# Patient Record
Sex: Female | Born: 1940
Health system: Southern US, Community
[De-identification: ages and names within clinical notes are randomized; demographics above are authoritative.]

## PROBLEM LIST (undated history)

## (undated) DIAGNOSIS — E079 Disorder of thyroid, unspecified: Secondary | ICD-10-CM

## (undated) DIAGNOSIS — E7800 Pure hypercholesterolemia, unspecified: Secondary | ICD-10-CM

## (undated) DIAGNOSIS — F419 Anxiety disorder, unspecified: Secondary | ICD-10-CM

## (undated) DIAGNOSIS — G459 Transient cerebral ischemic attack, unspecified: Secondary | ICD-10-CM

## (undated) HISTORY — DX: Transient cerebral ischemic attack, unspecified: G45.9

## (undated) HISTORY — DX: Anxiety disorder, unspecified: F41.9

## (undated) HISTORY — PX: CHOLECYSTECTOMY: SHX55

---

## 2014-09-22 ENCOUNTER — Other Ambulatory Visit: Payer: Self-pay | Admitting: Internal Medicine

## 2014-09-22 DIAGNOSIS — H9201 Otalgia, right ear: Secondary | ICD-10-CM

## 2014-09-22 DIAGNOSIS — G500 Trigeminal neuralgia: Secondary | ICD-10-CM

## 2014-09-22 DIAGNOSIS — R519 Headache, unspecified: Secondary | ICD-10-CM

## 2014-09-22 DIAGNOSIS — R51 Headache: Secondary | ICD-10-CM

## 2014-09-29 ENCOUNTER — Ambulatory Visit
Admission: RE | Admit: 2014-09-29 | Discharge: 2014-09-29 | Disposition: A | Discharge status: Home / Self Care | Payer: PRIVATE HEALTH INSURANCE | Source: Ambulatory Visit | Attending: Internal Medicine | Admitting: Internal Medicine

## 2014-09-29 ENCOUNTER — Other Ambulatory Visit: Payer: Self-pay | Admitting: Internal Medicine

## 2014-09-29 DIAGNOSIS — G500 Trigeminal neuralgia: Secondary | ICD-10-CM

## 2014-09-29 DIAGNOSIS — R519 Headache, unspecified: Secondary | ICD-10-CM

## 2014-09-29 DIAGNOSIS — R51 Headache: Secondary | ICD-10-CM

## 2014-09-29 DIAGNOSIS — H9201 Otalgia, right ear: Secondary | ICD-10-CM

## 2016-09-16 ENCOUNTER — Encounter (HOSPITAL_COMMUNITY): Payer: Self-pay | Admitting: Emergency Medicine

## 2016-09-16 ENCOUNTER — Ambulatory Visit (HOSPITAL_COMMUNITY)
Admission: EM | Admit: 2016-09-16 | Discharge: 2016-09-16 | Disposition: A | Discharge status: Home / Self Care | Payer: Medicare Other | Attending: Emergency Medicine | Admitting: Emergency Medicine

## 2016-09-16 DIAGNOSIS — H6501 Acute serous otitis media, right ear: Secondary | ICD-10-CM | POA: Diagnosis not present

## 2016-09-16 HISTORY — DX: Disorder of thyroid, unspecified: E07.9

## 2016-09-16 HISTORY — DX: Pure hypercholesterolemia, unspecified: E78.00

## 2016-09-16 MED ORDER — FLUCONAZOLE 150 MG PO TABS: 150.0000 mg | ORAL_TABLET | Freq: Once | ORAL | 0 refills | Status: AC

## 2016-09-16 MED ORDER — AMOXICILLIN 875 MG PO TABS: 875.0000 mg | ORAL_TABLET | Freq: Two times a day (BID) | ORAL | 0 refills | Status: DC

## 2016-09-16 NOTE — Discharge Instructions (Signed)
You have fluid in your ear. This is likely causing your symptoms. Take amoxicillin twice a day for 10 days. Continue ibuprofen and heat as needed. Use the Diflucan if you develop symptoms of a yeast infection. If this becomes a recurring problem, please follow-up with your primary care doctor as you may need a referral to ENT.

## 2016-09-16 NOTE — ED Provider Notes (Signed)
[facility]    CSN: [id number] Arrival date & time: 09/16/16  1354     History   Chief Complaint Chief Complaint  Patient presents with  . Otalgia    HPI Olivia Estes is a 76 y.o. female.   HPI  She is a 76 year old woman here with her daughter for evaluation of right earache. Symptoms started yesterday with an intermittent right earache. She also reports some right-sided maxillary sinus pain and pressure. This is associated with right-sided drainage that was blood-tinged. No fevers. No cough. She does report some sneezing. She took ibuprofen and applied heat yesterday. Today, her symptoms are better but she continues to have some right-sided maxillary pressure.  Past Medical History:  Diagnosis Date  . Hypercholesterolemia   . Thyroid disease     There are no active problems to display for this patient.   Past Surgical History:  Procedure Laterality Date  . CHOLECYSTECTOMY      OB History    No data available       Home Medications    Prior to Admission medications   Medication Sig Start Date End Date Taking? Authorizing Provider  alendronate (FOSAMAX) 70 MG tablet Take 70 mg by mouth once a week. Take with a full glass of water on an empty stomach.   Yes Historical Provider, MD  cyclobenzaprine (FLEXERIL) 10 MG tablet Take 10 mg by mouth 3 (three) times daily as needed for muscle spasms.   Yes Historical Provider, MD  levothyroxine (SYNTHROID, LEVOTHROID) 75 MCG tablet Take 75 mcg by mouth daily before breakfast.   Yes Historical Provider, MD  rosuvastatin (CRESTOR) 5 MG tablet Take 5 mg by mouth daily.   Yes Historical Provider, MD  amoxicillin (AMOXIL) 875 MG tablet Take 1 tablet (875 mg total) by mouth 2 (two) times daily. 09/16/16   Charm RingsErin J Honig, MD  fluconazole (DIFLUCAN) 150 MG tablet Take 1 tablet (150 mg total) by mouth once. Take 2nd pill if symptoms not completely resolved in 3 days. 09/16/16 09/16/16  Charm RingsErin J Honig, MD    Family  History History reviewed. No pertinent family history.  Social History Social History  Substance Use Topics  . Smoking status: Never Smoker  . Smokeless tobacco: Never Used  . Alcohol use No     Allergies   Ceftin [cefuroxime axetil]   Review of Systems Review of Systems As in history of present illness  Physical Exam Triage Vital Signs ED Triage Vitals  Enc Vitals Group     BP 09/16/16 1439 168/96     Pulse Rate 09/16/16 1439 84     Resp 09/16/16 1439 18     Temp 09/16/16 1439 98.2 F (36.8 C)     Temp Source 09/16/16 1439 Oral     SpO2 09/16/16 1439 98 %     Weight --      Height --      Head Circumference --      Peak Flow --      Pain Score 09/16/16 1444 1     Pain Loc --      Pain Edu? --      Excl. in GC? --    No data found.   Updated Vital Signs BP 168/96 (BP Location: Left Arm)   Pulse 84   Temp 98.2 F (36.8 C) (Oral)   Resp 18   SpO2 98%   Visual Acuity Right Eye Distance:   Left Eye Distance:   Bilateral Distance:  Right Eye Near:   Left Eye Near:    Bilateral Near:     Physical Exam  Constitutional: She is oriented to person, place, and time. She appears well-developed and well-nourished. No distress.  HENT:  Left TM is normal. Right TM has a clear effusion. Left knee are normal. Right naris erythematous and edematous. It does appear friable.  Neck: Neck supple.  Cardiovascular: Normal rate.   Pulmonary/Chest: Effort normal.  Lymphadenopathy:    She has no cervical adenopathy.  Neurological: She is alert and oriented to person, place, and time.     UC Treatments / Results  Labs (all labs ordered are listed, but only abnormal results are displayed) Labs Reviewed - No data to display  EKG  EKG Interpretation None       Radiology No results found.  Procedures Procedures (including critical care time)  Medications Ordered in UC Medications - No data to display   Initial Impression / Assessment and Plan / UC  Course  I have reviewed the triage vital signs and the nursing notes.  Pertinent labs & imaging results that were available during my care of the patient were reviewed by me and considered in my medical decision making (see chart for details).  Clinical Course     We'll treat with amoxicillin. Continue ibuprofen and heat as needed. Prescription for Diflucan provided she often gets yeast infections after antibiotics. Follow-up as needed.  Final Clinical Impressions(s) / UC Diagnoses   Final diagnoses:  Right acute serous otitis media, recurrence not specified    New Prescriptions Discharge Medication List as of 09/16/2016  3:16 PM    START taking these medications   Details  amoxicillin (AMOXIL) 875 MG tablet Take 1 tablet (875 mg total) by mouth 2 (two) times daily., Starting Mon 09/16/2016, Normal    fluconazole (DIFLUCAN) 150 MG tablet Take 1 tablet (150 mg total) by mouth once. Take 2nd pill if symptoms not completely resolved in 3 days., Starting Mon 09/16/2016, Normal         Charm Rings, MD 09/16/16 1535

## 2016-09-16 NOTE — ED Triage Notes (Signed)
[facility] a [facility] of right side ear pain and sinus pain and pressure that started yesterday.

## 2016-11-08 ENCOUNTER — Emergency Department (HOSPITAL_COMMUNITY): Payer: PRIVATE HEALTH INSURANCE

## 2016-11-08 ENCOUNTER — Ambulatory Visit (HOSPITAL_COMMUNITY)
Admission: EM | Admit: 2016-11-08 | Discharge: 2016-11-08 | Disposition: A | Discharge status: Home / Self Care | Payer: PRIVATE HEALTH INSURANCE

## 2016-11-08 ENCOUNTER — Encounter (HOSPITAL_COMMUNITY): Payer: Self-pay

## 2016-11-08 ENCOUNTER — Emergency Department (HOSPITAL_COMMUNITY)
Admission: EM | Admit: 2016-11-08 | Discharge: 2016-11-08 | Disposition: A | Discharge status: Home / Self Care | Payer: PRIVATE HEALTH INSURANCE | Attending: Emergency Medicine | Admitting: Emergency Medicine

## 2016-11-08 DIAGNOSIS — R1031 Right lower quadrant pain: Secondary | ICD-10-CM

## 2016-11-08 DIAGNOSIS — Z79899 Other long term (current) drug therapy: Secondary | ICD-10-CM | POA: Insufficient documentation

## 2016-11-08 LAB — URINALYSIS, ROUTINE W REFLEX MICROSCOPIC
Bilirubin Urine: NEGATIVE
GLUCOSE, UA: NEGATIVE mg/dL
Hgb urine dipstick: NEGATIVE
Ketones, ur: 5 mg/dL — AB
LEUKOCYTES UA: NEGATIVE
Nitrite: NEGATIVE
PH: 6 (ref 5.0–8.0)
PROTEIN: NEGATIVE mg/dL
Specific Gravity, Urine: 1.004 — ABNORMAL LOW (ref 1.005–1.030)

## 2016-11-08 LAB — CBC
HEMATOCRIT: 40 % (ref 36.0–46.0)
HEMOGLOBIN: 13.8 g/dL (ref 12.0–15.0)
MCH: 28.9 pg (ref 26.0–34.0)
MCHC: 34.5 g/dL (ref 30.0–36.0)
MCV: 83.9 fL (ref 78.0–100.0)
Platelets: 222 10*3/uL (ref 150–400)
RBC: 4.77 MIL/uL (ref 3.87–5.11)
RDW: 12.8 % (ref 11.5–15.5)
WBC: 7.4 10*3/uL (ref 4.0–10.5)

## 2016-11-08 LAB — COMPREHENSIVE METABOLIC PANEL
ALT: 16 U/L (ref 14–54)
AST: 21 U/L (ref 15–41)
Albumin: 4.7 g/dL (ref 3.5–5.0)
Alkaline Phosphatase: 40 U/L (ref 38–126)
Anion gap: 7 (ref 5–15)
BUN: 16 mg/dL (ref 6–20)
CO2: 27 mmol/L (ref 22–32)
Calcium: 9.5 mg/dL (ref 8.9–10.3)
Chloride: 107 mmol/L (ref 101–111)
Creatinine, Ser: 0.61 mg/dL (ref 0.44–1.00)
GFR calc Af Amer: >60 mL/min (ref >60)
GFR calc non Af Amer: >60 mL/min (ref >60)
Glucose, Bld: 109 mg/dL — ABNORMAL HIGH (ref 65–99)
Potassium: 3.9 mmol/L (ref 3.5–5.1)
Sodium: 141 mmol/L (ref 135–145)
Total Bilirubin: 1.6 mg/dL — ABNORMAL HIGH (ref 0.3–1.2)
Total Protein: 7.5 g/dL (ref 6.5–8.1)

## 2016-11-08 LAB — LIPASE, BLOOD: Lipase: 22 U/L (ref 11–51)

## 2016-11-08 MED ORDER — IOPAMIDOL (ISOVUE-300) INJECTION 61%
100.0000 mL | Freq: Once | INTRAVENOUS | Status: AC | PRN
  Administered 2016-11-08: 75 mL via INTRAVENOUS

## 2016-11-08 MED ORDER — IOPAMIDOL (ISOVUE-300) INJECTION 61%
INTRAVENOUS | Status: AC
  Filled 2016-11-08: qty 100

## 2016-11-08 NOTE — ED Triage Notes (Signed)
Pt states that her PCP recommended that she be evaluated for her RLQ abdominal pain. Pt states that the pain comes and goes. She denies N/V, but states that she had an episode of diarrhea yesterday. A&Ox4. Ambulatory.

## 2016-11-08 NOTE — ED Notes (Signed)
ED Provider at bedside. 

## 2016-11-08 NOTE — ED Notes (Signed)
Patient transported to CT 

## 2016-11-08 NOTE — ED Provider Notes (Signed)
[facility] Provider Note   CSN: [id number] Arrival date & time: 11/08/16  1835     History   Chief Complaint Chief Complaint  Patient presents with  . Abdominal Pain    HPI Olivia Estes is a 76 y.o. female.  76 yo F with a chief complaint of right lower quadrant abdominal pain. This been going on for the past couple days. Pain lasts for just seconds and then goes away. She describes it as sharp and shooting. Denies radiation. Denies anything that makes it better or worse. Denies fevers denies chills denies nausea vomiting or diarrhea. Denies constipation. Denies urinary symptoms.   The history is provided by the patient.  Abdominal Pain   This is a new problem. The current episode started 2 days ago. The problem occurs constantly. The problem has not changed since onset.The pain is associated with an unknown factor. The pain is located in the RLQ. The quality of the pain is cramping, sharp and shooting. The pain is at a severity of 5/10. The pain is moderate. Pertinent negatives include fever, nausea, vomiting, constipation, dysuria, headaches, arthralgias and myalgias. Nothing aggravates the symptoms. Nothing relieves the symptoms.    Past Medical History:  Diagnosis Date  . Hypercholesterolemia   . Thyroid disease     There are no active problems to display for this patient.   Past Surgical History:  Procedure Laterality Date  . CHOLECYSTECTOMY      OB History    No data available       Home Medications    Prior to Admission medications   Medication Sig Start Date End Date Taking? Authorizing Provider  alendronate (FOSAMAX) 70 MG tablet Take 70 mg by mouth once a week. Take with a full glass of water on an empty stomach.   Yes Historical Provider, MD  CRANBERRY PO Take 2 tablets by mouth daily.   Yes Historical Provider, MD  cyclobenzaprine (FLEXERIL) 10 MG tablet Take 10 mg by mouth 3 (three) times daily as needed for muscle spasms.   Yes Historical  Provider, MD  ESTRACE VAGINAL 0.1 MG/GM vaginal cream Place 1 g vaginally 2 (two) times a week. Thursday & Sunday 08/10/16  Yes Historical Provider, MD  ibuprofen (ADVIL,MOTRIN) 200 MG tablet Take 600 mg by mouth every 6 (six) hours as needed.   Yes Historical Provider, MD  levothyroxine (SYNTHROID, LEVOTHROID) 75 MCG tablet Take 75 mcg by mouth daily before breakfast.   Yes Historical Provider, MD  Multiple Vitamins-Minerals (CENTRUM SILVER ULTRA WOMENS PO) Take 1 tablet by mouth daily.   Yes Historical Provider, MD  rosuvastatin (CRESTOR) 5 MG tablet Take 5 mg by mouth daily.   Yes Historical Provider, MD  saccharomyces boulardii (FLORASTOR) 250 MG capsule Take 250 mg by mouth 2 (two) times daily.   Yes Historical Provider, MD    Family History History reviewed. No pertinent family history.  Social History Social History  Substance Use Topics  . Smoking status: Never Smoker  . Smokeless tobacco: Never Used  . Alcohol use No     Allergies   Ceftin [cefuroxime axetil]   Review of Systems Review of Systems  Constitutional: Negative for chills and fever.  HENT: Negative for congestion and rhinorrhea.   Eyes: Negative for redness and visual disturbance.  Respiratory: Negative for shortness of breath and wheezing.   Cardiovascular: Negative for chest pain and palpitations.  Gastrointestinal: Positive for abdominal pain. Negative for constipation, nausea and vomiting.  Genitourinary: Negative for dysuria and urgency.  Musculoskeletal: Negative for arthralgias and myalgias.  Skin: Negative for pallor and wound.  Neurological: Negative for dizziness and headaches.     Physical Exam Updated Vital Signs BP 147/88 (BP Location: Right Arm)   Pulse 72   Temp 98.2 F (36.8 C) (Oral)   Resp 16   Ht 4\' 11"  (1.499 m)   Wt 120 lb (54.4 kg)   SpO2 98%   BMI 24.24 kg/m   Physical Exam  Constitutional: She is oriented to person, place, and time. She appears well-developed and  well-nourished. No distress.  HENT:  Head: Normocephalic and atraumatic.  Eyes: EOM are normal. Pupils are equal, round, and reactive to light.  Neck: Normal range of motion. Neck supple.  Cardiovascular: Normal rate and regular rhythm.  Exam reveals no gallop and no friction rub.   No murmur heard. Pulmonary/Chest: Effort normal. She has no wheezes. She has no rales.  Abdominal: Soft. She exhibits no distension and no mass. There is tenderness (tender palpation just above the inguinal ligament on the right side.). There is no guarding.  Musculoskeletal: She exhibits no edema or tenderness.  Neurological: She is alert and oriented to person, place, and time.  Skin: Skin is warm and dry. She is not diaphoretic.  Psychiatric: She has a normal mood and affect. Her behavior is normal.  Nursing note and vitals reviewed.    ED Treatments / Results  Labs (all labs ordered are listed, but only abnormal results are displayed) Labs Reviewed  COMPREHENSIVE METABOLIC PANEL - Abnormal; Notable for the following:       Result Value   Glucose, Bld 109 (*)    Total Bilirubin 1.6 (*)    All other components within normal limits  URINALYSIS, ROUTINE W REFLEX MICROSCOPIC - Abnormal; Notable for the following:    Color, Urine STRAW (*)    Specific Gravity, Urine 1.004 (*)    Ketones, ur 5 (*)    All other components within normal limits  LIPASE, BLOOD  CBC    EKG  EKG Interpretation None       Radiology Ct Abdomen Pelvis W Contrast  Result Date: 11/08/2016 CLINICAL DATA:  Right lower quadrant pain intermittently EXAM: CT ABDOMEN AND PELVIS WITH CONTRAST TECHNIQUE: Multidetector CT imaging of the abdomen and pelvis was performed using the standard protocol following bolus administration of intravenous contrast. CONTRAST:  75mL ISOVUE-300 IOPAMIDOL (ISOVUE-300) INJECTION 61% COMPARISON:  None. FINDINGS: Lower chest: Lung bases are within normal.  Small hiatal hernia. Hepatobiliary: The  previous cholecystectomy. Liver is within normal. Slight prominence of central intrahepatic biliary ducts likely due to the post cholecystectomy state. Pancreas: Within normal. Spleen: Within normal. Adrenals/Urinary Tract: Adrenal glands are normal. Kidneys are normal in size without hydronephrosis or nephrolithiasis. Couple tiny subcentimeter renal cortical hypodensities likely cysts but too small to characterize. Ureters and bladder are normal. Stomach/Bowel: Small hiatal hernia. Small bowel is otherwise within normal. Appendix is normal. Diverticulosis of the colon without active inflammation. Vascular/Lymphatic: Mild-to-moderate calcified plaque of the abdominal aorta and iliac arteries. No evidence of adenopathy. Reproductive: Within normal. Other: No free fluid or focal inflammatory change. Musculoskeletal: Mild degenerate change of the spine and hips. IMPRESSION: No acute findings in the abdomen/ pelvis. Diverticulosis of the colon without active inflammation. Couple tiny bilateral renal cortical hypodensities too small to characterize but likely cysts. Small hiatal hernia. Aortic atherosclerosis. Electronically Signed   By: Elberta Fortis M.D.   On: 11/08/2016 21:26    Procedures Procedures (including critical care time)  Medications Ordered in ED Medications  iopamidol (ISOVUE-300) 61 % injection (not administered)  iopamidol (ISOVUE-300) 61 % injection 100 mL (75 mLs Intravenous Contrast Given 11/08/16 2052)     Initial Impression / Assessment and Plan / ED Course  I have reviewed the triage vital signs and the nursing notes.  Pertinent labs & imaging results that were available during my care of the patient were reviewed by me and considered in my medical decision making (see chart for details).     42 yoF With a chief complaint of right lower quadrant abdominal pain. Clinically this pain is very low almost adnexal.  No pain along the femoral canal. Will obtain a CT scan to further  evaluate.  CT scan was reviewed with no specific finding. I reviewed the images and in the area of the patient's pain she does have a fairly large stool burden. I discussed these results with the patient. She will try a MiraLAX cleanout at home. Will return for sudden worsening or fever.   11:48 PM:  I have discussed the diagnosis/risks/treatment options with the patient and family and believe the pt to be eligible for discharge home to follow-up with PCP. We also discussed returning to the ED immediately if new or worsening sx occur. We discussed the sx which are most concerning (e.g., sudden worsening pain, fever, inability to tolerate by mouth) that necessitate immediate return. Medications administered to the patient during their visit and any new prescriptions provided to the patient are listed below.  Medications given during this visit Medications  iopamidol (ISOVUE-300) 61 % injection (not administered)  iopamidol (ISOVUE-300) 61 % injection 100 mL (75 mLs Intravenous Contrast Given 11/08/16 2052)     The patient appears reasonably screen and/or stabilized for discharge and I doubt any other medical condition or other [facility] requiring further screening, evaluation, or treatment in the ED at this time prior to discharge.    Final Clinical Impressions(s) / ED Diagnoses   Final diagnoses:  Right lower quadrant abdominal pain    New Prescriptions Discharge Medication List as of 11/08/2016 10:19 PM       Melene Plan, DO 11/08/16 2348

## 2016-11-08 NOTE — Discharge Instructions (Signed)
Follow up with your family doc.  Return for sudden worsening pain, fever.

## 2017-10-17 ENCOUNTER — Emergency Department (HOSPITAL_COMMUNITY): Payer: Medicare Other

## 2017-10-17 ENCOUNTER — Other Ambulatory Visit: Payer: Self-pay

## 2017-10-17 ENCOUNTER — Encounter (HOSPITAL_COMMUNITY): Payer: Self-pay

## 2017-10-17 ENCOUNTER — Observation Stay (HOSPITAL_COMMUNITY)
Admission: EM | Admit: 2017-10-17 | Discharge: 2017-10-18 | Disposition: A | Discharge status: Home / Self Care | Payer: Medicare Other | Attending: Family Medicine | Admitting: Family Medicine

## 2017-10-17 DIAGNOSIS — G459 Transient cerebral ischemic attack, unspecified: Secondary | ICD-10-CM | POA: Diagnosis not present

## 2017-10-17 DIAGNOSIS — E039 Hypothyroidism, unspecified: Secondary | ICD-10-CM | POA: Diagnosis present

## 2017-10-17 DIAGNOSIS — R4701 Aphasia: Secondary | ICD-10-CM

## 2017-10-17 DIAGNOSIS — Z79899 Other long term (current) drug therapy: Secondary | ICD-10-CM | POA: Insufficient documentation

## 2017-10-17 DIAGNOSIS — E785 Hyperlipidemia, unspecified: Secondary | ICD-10-CM | POA: Diagnosis not present

## 2017-10-17 LAB — APTT: aPTT: 29 seconds (ref 24–36)

## 2017-10-17 LAB — DIFFERENTIAL
BASOS PCT: 0 %
Basophils Absolute: 0 10*3/uL (ref 0.0–0.1)
EOS PCT: 1 %
Eosinophils Absolute: 0.1 10*3/uL (ref 0.0–0.7)
Lymphocytes Relative: 30 %
Lymphs Abs: 2.3 10*3/uL (ref 0.7–4.0)
MONO ABS: 0.4 10*3/uL (ref 0.1–1.0)
MONOS PCT: 5 %
Neutro Abs: 5.1 10*3/uL (ref 1.7–7.7)
Neutrophils Relative %: 64 %

## 2017-10-17 LAB — CBC
HCT: 44.3 % (ref 36.0–46.0)
Hemoglobin: 15.3 g/dL — ABNORMAL HIGH (ref 12.0–15.0)
MCH: 29.1 pg (ref 26.0–34.0)
MCHC: 34.5 g/dL (ref 30.0–36.0)
MCV: 84.4 fL (ref 78.0–100.0)
PLATELETS: 238 10*3/uL (ref 150–400)
RBC: 5.25 MIL/uL — ABNORMAL HIGH (ref 3.87–5.11)
RDW: 12.9 % (ref 11.5–15.5)
WBC: 7.9 10*3/uL (ref 4.0–10.5)

## 2017-10-17 LAB — COMPREHENSIVE METABOLIC PANEL
ALT: 18 U/L (ref 14–54)
ANION GAP: 10 (ref 5–15)
AST: 25 U/L (ref 15–41)
Albumin: 5 g/dL (ref 3.5–5.0)
Alkaline Phosphatase: 51 U/L (ref 38–126)
BUN: 14 mg/dL (ref 6–20)
CO2: 26 mmol/L (ref 22–32)
CREATININE: 0.62 mg/dL (ref 0.44–1.00)
Calcium: 10 mg/dL (ref 8.9–10.3)
Chloride: 105 mmol/L (ref 101–111)
Glucose, Bld: 120 mg/dL — ABNORMAL HIGH (ref 65–99)
Potassium: 3.7 mmol/L (ref 3.5–5.1)
SODIUM: 141 mmol/L (ref 135–145)
Total Bilirubin: 1.3 mg/dL — ABNORMAL HIGH (ref 0.3–1.2)
Total Protein: 8.2 g/dL — ABNORMAL HIGH (ref 6.5–8.1)

## 2017-10-17 LAB — URINALYSIS, ROUTINE W REFLEX MICROSCOPIC
BILIRUBIN URINE: NEGATIVE
Glucose, UA: NEGATIVE mg/dL
Hgb urine dipstick: NEGATIVE
Ketones, ur: NEGATIVE mg/dL
Leukocytes, UA: NEGATIVE
Nitrite: NEGATIVE
PH: 6 (ref 5.0–8.0)
Protein, ur: NEGATIVE mg/dL
SPECIFIC GRAVITY, URINE: 1.005 (ref 1.005–1.030)

## 2017-10-17 LAB — I-STAT CHEM 8, ED
BUN: 13 mg/dL (ref 6–20)
CALCIUM ION: 1.18 mmol/L (ref 1.15–1.40)
CHLORIDE: 103 mmol/L (ref 101–111)
Creatinine, Ser: 0.7 mg/dL (ref 0.44–1.00)
Glucose, Bld: 118 mg/dL — ABNORMAL HIGH (ref 65–99)
HCT: 43 % (ref 36.0–46.0)
HEMOGLOBIN: 14.6 g/dL (ref 12.0–15.0)
Potassium: 4 mmol/L (ref 3.5–5.1)
SODIUM: 141 mmol/L (ref 135–145)
TCO2: 27 mmol/L (ref 22–32)

## 2017-10-17 LAB — RAPID URINE DRUG SCREEN, HOSP PERFORMED
AMPHETAMINES: NOT DETECTED
Barbiturates: NOT DETECTED
Benzodiazepines: NOT DETECTED
Cocaine: NOT DETECTED
OPIATES: NOT DETECTED
Tetrahydrocannabinol: NOT DETECTED

## 2017-10-17 LAB — I-STAT TROPONIN, ED: TROPONIN I, POC: 0 ng/mL (ref 0.00–0.08)

## 2017-10-17 LAB — PROTIME-INR
INR: 0.99
Prothrombin Time: 13 seconds (ref 11.4–15.2)

## 2017-10-17 LAB — ETHANOL

## 2017-10-17 MED ORDER — ALPRAZOLAM 0.25 MG PO TABS
0.2500 mg | ORAL_TABLET | Freq: Every evening | ORAL | Status: DC | PRN
  Administered 2017-10-18: 0.25 mg via ORAL
  Filled 2017-10-17: qty 1

## 2017-10-17 MED ORDER — LEVOTHYROXINE SODIUM 75 MCG PO TABS
75.0000 ug | ORAL_TABLET | Freq: Every day | ORAL | Status: DC
  Administered 2017-10-18: 75 ug via ORAL
  Filled 2017-10-17: qty 1

## 2017-10-17 MED ORDER — STROKE: EARLY STAGES OF RECOVERY BOOK
Freq: Once | Status: AC
  Administered 2017-10-18: 07:00:00
  Filled 2017-10-17 (×2): qty 1

## 2017-10-17 MED ORDER — ENOXAPARIN SODIUM 40 MG/0.4ML ~~LOC~~ SOLN
40.0000 mg | Freq: Every day | SUBCUTANEOUS | Status: DC
  Administered 2017-10-18: 40 mg via SUBCUTANEOUS
  Filled 2017-10-17: qty 0.4

## 2017-10-17 MED ORDER — SODIUM CHLORIDE 0.9 % IV SOLN
INTRAVENOUS | Status: DC
  Administered 2017-10-17: 23:00:00 via INTRAVENOUS

## 2017-10-17 MED ORDER — ASPIRIN 325 MG PO TABS
325.0000 mg | ORAL_TABLET | Freq: Every day | ORAL | Status: DC
  Administered 2017-10-17 – 2017-10-18 (×2): 325 mg via ORAL
  Filled 2017-10-17 (×2): qty 1

## 2017-10-17 MED ORDER — CYCLOBENZAPRINE HCL 10 MG PO TABS: 10.0000 mg | ORAL_TABLET | Freq: Three times a day (TID) | ORAL | Status: DC | PRN

## 2017-10-17 MED ORDER — ASPIRIN 300 MG RE SUPP: 300.0000 mg | Freq: Every day | RECTAL | Status: DC

## 2017-10-17 MED ORDER — ROSUVASTATIN CALCIUM 5 MG PO TABS
5.0000 mg | ORAL_TABLET | Freq: Every day | ORAL | Status: DC
  Administered 2017-10-18: 5 mg via ORAL
  Filled 2017-10-17: qty 1

## 2017-10-17 MED ORDER — ACETAMINOPHEN 160 MG/5ML PO SOLN: 650.0000 mg | ORAL | Status: DC | PRN

## 2017-10-17 MED ORDER — ACETAMINOPHEN 650 MG RE SUPP: 650.0000 mg | RECTAL | Status: DC | PRN

## 2017-10-17 MED ORDER — ACETAMINOPHEN 325 MG PO TABS: 650.0000 mg | ORAL_TABLET | ORAL | Status: DC | PRN

## 2017-10-17 NOTE — Consult Note (Signed)
Referring Physician: Dr. Hal Hope    Chief Complaint: TIA  HPI: Olivia Estes is an 77 y.o. female who presented to the [facility] ED on Friday afternoon after a 10 minute episode of slurred speech where she could not get her words out right. Her symptoms began at 1130 while speaking on the telephone. She noted that the words were "right in her head" but that they would not come out. She had no other Neurological symptoms, other than possibly some slight numbness to the left side of her head. She has not had a similar episode in the past. She called her doctor and was told to come to the ED. In Triage the patient was asymptomatic; there was no weakness of her extremities and no slurred speech. She has passed her stroke swallow screen (completed at 1800).   CT head revealed atrophy and mild chronic microvascular ischemic changes. No acute findings were seen.   EKG with NSR.  Her PMHx includes hypercholesterolemia and thyroid disease.   Past Medical History:  Diagnosis Date  . Hypercholesterolemia   . Thyroid disease     Past Surgical History:  Procedure Laterality Date  . CHOLECYSTECTOMY      Family History  Problem Relation Age of Onset  . Diabetes Mellitus II Mother   . Hypertension Father    Social History:  reports that she has quit smoking. she has never used smokeless tobacco. She reports that she does not drink alcohol or use drugs.  Allergies:  Allergies  Allergen Reactions  . Ceftin [Cefuroxime Axetil] Rash    Medications:  Prior to Admission:  Medications Prior to Admission  Medication Sig Dispense Refill Last Dose  . ALPRAZolam (XANAX) 0.25 MG tablet Take 0.25 mg by mouth at bedtime as needed.   10/16/2017 at Unknown time  . CRANBERRY PO Take 2 tablets by mouth daily.   10/17/2017 at Unknown time  . cyclobenzaprine (FLEXERIL) 10 MG tablet Take 10 mg by mouth 3 (three) times daily as needed for muscle spasms.   Past Month at Unknown time  . ESTRACE VAGINAL 0.1 MG/GM  vaginal cream Place 1 g vaginally 2 (two) times a week. Thursday & Sunday  11 10/16/2017 at Unknown time  . hydrOXYzine (ATARAX/VISTARIL) 25 MG tablet Take 25 mg by mouth at bedtime.  0 10/16/2017 at Unknown time  . ibuprofen (ADVIL,MOTRIN) 200 MG tablet Take 600 mg by mouth every 6 (six) hours as needed.   10/16/2017 at Unknown time  . levothyroxine (SYNTHROID, LEVOTHROID) 75 MCG tablet Take 75 mcg by mouth daily before breakfast.   10/17/2017 at Unknown time  . Multiple Vitamins-Minerals (CENTRUM SILVER ULTRA WOMENS PO) Take 1 tablet by mouth daily.   10/17/2017 at Unknown time  . rosuvastatin (CRESTOR) 5 MG tablet Take 5 mg by mouth daily.   10/17/2017 at Unknown time  . saccharomyces boulardii (FLORASTOR) 250 MG capsule Take 250 mg by mouth 2 (two) times daily.   10/17/2017 at Unknown time  . alendronate (FOSAMAX) 70 MG tablet Take 70 mg by mouth once a week. Take with a full glass of water on an empty stomach.   Not Taking at Unknown time    ROS: Had a dull headache like sensation in her left temporal region at the time of her transient aphasia. No other sensory symptoms. No motor weakness. Other ROS as per HPI.   Physical Examination: Blood pressure (!) 184/74, pulse 71, temperature 98.4 F (36.9 C), temperature source Oral, resp. rate 18, height 4' 11"  (1.499  m), weight 58 kg (127 lb 13.9 oz), SpO2 99 %.  HEENT: [place]/AT Lungs: Respirations unlabored Ext: No edema  Neurologic Examination: Mental Status:  Alert, oriented, thought content appropriate.  Speech fluent with intact naming, repetition and comprehension. Able to follow a 3 step directional command without difficulty. Cranial Nerves: II:  Visual fields intact. No extinction to DSS. PERRL.  III,IV, VI: EOMI without nystagmus. No ptosis. V,VII: Smile and grimace are symmetric. Facial temp sensation normal in V1 and V3 bilaterally. VIII: Hearing intact to voice IX,X: No hypophonia. Palate rises symmetrically XI: Symmetric XII: midline  tongue extension  Motor: Right : Upper extremity   5/5    Left:     Upper extremity   5/5  Lower extremity   5/5     Lower extremity   5/5 Normal tone throughout; normal bulk for age. No asymmetry. No pronator drift.  Sensory: Temp and light touch intact x 4 without extinction.  Deep Tendon Reflexes:  2+ bilateral brachioradialis, biceps and patellae. 1+ achilles bilaterally. Toes equivocal.  Cerebellar: No ataxia with FNF bilaterally Gait: Deferred.   Results for orders placed or performed during the hospital encounter of 10/17/17 (from the past 48 hour(s))  Urine rapid drug screen (hosp performed)     Status: None   Collection Time: 10/17/17  5:22 PM  Result Value Ref Range   Opiates NONE DETECTED NONE DETECTED   Cocaine NONE DETECTED NONE DETECTED   Benzodiazepines NONE DETECTED NONE DETECTED   Amphetamines NONE DETECTED NONE DETECTED   Tetrahydrocannabinol NONE DETECTED NONE DETECTED   Barbiturates NONE DETECTED NONE DETECTED    Comment: (NOTE) DRUG SCREEN FOR MEDICAL PURPOSES ONLY.  IF CONFIRMATION IS NEEDED FOR ANY PURPOSE, NOTIFY LAB WITHIN 5 DAYS. LOWEST DETECTABLE LIMITS FOR URINE DRUG SCREEN Drug Class                     Cutoff (ng/mL) Amphetamine and metabolites    1000 Barbiturate and metabolites    200 Benzodiazepine                 160 Tricyclics and metabolites     300 Opiates and metabolites        300 Cocaine and metabolites        300 THC                            50 Performed at [facility], [place] [address] [place], [place] [address]   Urinalysis, Routine w reflex microscopic     Status: Abnormal   Collection Time: 10/17/17  5:22 PM  Result Value Ref Range   Color, Urine STRAW (A) YELLOW   APPearance CLEAR CLEAR   Specific Gravity, Urine 1.005 1.005 - 1.030   pH 6.0 5.0 - 8.0   Glucose, UA NEGATIVE NEGATIVE mg/dL   Hgb urine dipstick NEGATIVE NEGATIVE   Bilirubin Urine NEGATIVE NEGATIVE   Ketones, ur NEGATIVE NEGATIVE mg/dL    Protein, ur NEGATIVE NEGATIVE mg/dL   Nitrite NEGATIVE NEGATIVE   Leukocytes, UA NEGATIVE NEGATIVE    Comment: Performed at [place] [address] [place], [place] [address]  Ethanol     Status: None   Collection Time: 10/17/17  5:40 PM  Result Value Ref Range   Alcohol, Ethyl (B) <10 <10 mg/dL    Comment:        LOWEST DETECTABLE LIMIT FOR SERUM ALCOHOL IS 10 mg/dL  FOR MEDICAL PURPOSES ONLY Performed at [facility], [place] [address] [place], [place] [address]   Protime-INR     Status: None   Collection Time: 10/17/17  5:40 PM  Result Value Ref Range   Prothrombin Time 13.0 11.4 - 15.2 seconds   INR 0.99     Comment: Performed at [facility], [place] [address] [place], [place] [address]  APTT     Status: None   Collection Time: 10/17/17  5:40 PM  Result Value Ref Range   aPTT 29 24 - 36 seconds    Comment: Performed at [facility], [place] [address] [place], [place] [address]  CBC     Status: Abnormal   Collection Time: 10/17/17  5:40 PM  Result Value Ref Range   WBC 7.9 4.0 - 10.5 K/uL   RBC 5.25 (H) 3.87 - 5.11 MIL/uL   Hemoglobin 15.3 (H) 12.0 - 15.0 g/dL   HCT 44.3 36.0 - 46.0 %   MCV 84.4 78.0 - 100.0 fL   MCH 29.1 26.0 - 34.0 pg   MCHC 34.5 30.0 - 36.0 g/dL   RDW 12.9 11.5 - 15.5 %   Platelets 238 150 - 400 K/uL    Comment: Performed at [facility], [place] [address] [place], [place] [address]  Differential     Status: None   Collection Time: 10/17/17  5:40 PM  Result Value Ref Range   Neutrophils Relative % 64 %   Neutro Abs 5.1 1.7 - 7.7 K/uL   Lymphocytes Relative 30 %   Lymphs Abs 2.3 0.7 - 4.0 K/uL   Monocytes Relative 5 %   Monocytes Absolute 0.4 0.1 - 1.0 K/uL   Eosinophils Relative 1 %   Eosinophils Absolute 0.1 0.0 - 0.7 K/uL   Basophils Relative 0 %   Basophils Absolute 0.0 0.0 - 0.1 K/uL    Comment: Performed at [facility], [place]  [address] [place], [place] [address]  Comprehensive metabolic panel     Status: Abnormal   Collection Time: 10/17/17  5:40 PM  Result Value Ref Range   Sodium 141 135 - 145 mmol/L   Potassium 3.7 3.5 - 5.1 mmol/L   Chloride 105 101 - 111 mmol/L   CO2 26 22 - 32 mmol/L   Glucose, Bld 120 (H) 65 - 99 mg/dL   BUN 14 6 - 20 mg/dL   Creatinine, Ser 0.62 0.44 - 1.00 mg/dL   Calcium 10.0 8.9 - 10.3 mg/dL   Total Protein 8.2 (H) 6.5 - 8.1 g/dL   Albumin 5.0 3.5 - 5.0 g/dL   AST 25 15 - 41 U/L   ALT 18 14 - 54 U/L   Alkaline Phosphatase 51 38 - 126 U/L   Total Bilirubin 1.3 (H) 0.3 - 1.2 mg/dL   GFR calc non Af Amer >60 >60 mL/min   GFR calc Af Amer >60 >60 mL/min    Comment: (NOTE) The eGFR has been calculated using the CKD EPI equation. This calculation has not been validated in all clinical situations. eGFR's persistently <60 mL/min signify possible Chronic Kidney Disease.    Anion gap 10 5 - 15    Comment: Performed at [facility], [place] [address] [place], [place] [address]  I-stat troponin, ED     Status: None   Collection Time: 10/17/17  5:53 PM  Result Value Ref Range   Troponin i, poc 0.00 0.00 - 0.08 ng/mL  Comment 3            Comment: Due to the release kinetics of cTnI, a negative result within the first hours of the onset of symptoms does not rule out myocardial infarction with certainty. If myocardial infarction is still suspected, repeat the test at appropriate intervals.   I-Stat Chem 8, ED     Status: Abnormal   Collection Time: 10/17/17  5:56 PM  Result Value Ref Range   Sodium 141 135 - 145 mmol/L   Potassium 4.0 3.5 - 5.1 mmol/L   Chloride 103 101 - 111 mmol/L   BUN 13 6 - 20 mg/dL   Creatinine, Ser 0.70 0.44 - 1.00 mg/dL   Glucose, Bld 118 (H) 65 - 99 mg/dL   Calcium, Ion 1.18 1.15 - 1.40 mmol/L   TCO2 27 22 - 32 mmol/L   Hemoglobin 14.6 12.0 - 15.0 g/dL   HCT 43.0 36.0 - 46.0 %   Dg Chest 2 View  Result Date: 10/17/2017 CLINICAL  DATA:  Slurred speech. EXAM: CHEST  2 VIEW COMPARISON:  None. FINDINGS: The heart size and mediastinal contours are within normal limits. Both lungs are clear. No pneumothorax or pleural effusion is noted. The visualized skeletal structures are unremarkable. IMPRESSION: No active cardiopulmonary disease. Electronically Signed   By: Marijo Conception, M.D.   On: 10/17/2017 19:35   Ct Head Wo Contrast  Result Date: 10/17/2017 CLINICAL DATA:  Speech difficulty EXAM: CT HEAD WITHOUT CONTRAST TECHNIQUE: Contiguous axial images were obtained from the base of the skull through the vertex without intravenous contrast. COMPARISON:  MRI 09/29/2014 FINDINGS: Brain: Mild age related volume loss. Mild chronic small vessel disease throughout the deep white matter. No acute intracranial abnormality. Specifically, no hemorrhage, hydrocephalus, mass lesion, acute infarction, or significant intracranial injury. Vascular: No hyperdense vessel or unexpected calcification. Skull: No acute calvarial abnormality. Sinuses/Orbits: Visualized paranasal sinuses and mastoids clear. Orbital soft tissues unremarkable. Other: None IMPRESSION: No acute intracranial abnormality. Atrophy, chronic microvascular disease. Electronically Signed   By: Rolm Baptise M.D.   On: 10/17/2017 18:05    Assessment: 77 y.o. female with episode of transient expressive dysphasia 1. Neurological examination nonfocal/non-lateralizing 2, CT head revealed atrophy and mild chronic microvascular ischemic changes. No acute findings were seen.  3, EKG with NSR. 4. Stroke Risk Factors - Hypercholesterolemia  Plan: 1. HgbA1c, fasting lipid panel 2. MRI, MRA of the brain without contrast 3. PT consult, OT consult, Speech consult 4. Echocardiogram 5. Carotid dopplers 6. Prophylactic therapy- Agree with starting ASA. Can switch to 81 mg to decrease risk of GI irritation or bleeding. Given her advanced age, can continue rosuvastatin at the current dose.  7. Risk  factor modification 8. Telemetry monitoring 9. Frequent neuro checks 10. Permissive HTN x 24 hours then gradually reduce BP  @Electronically  signed: Dr. Kerney Elbe  10/17/2017, 11:43 PM

## 2017-10-17 NOTE — ED Provider Notes (Signed)
[facility] Note   CSN: [id number] Arrival date & time: 10/17/17  1639     History   Chief Complaint Chief Complaint  Patient presents with  . Aphasia    HPI Olivia Estes is a 77 y.o. female.  HPI Patient presents after difficulty speaking.  At around 1130 today she had around 10 minutes where she could not speak.  States the words were right in her head but that would not come out.  States she was watching TV at the time.  No numbness or weakness otherwise.  No headache or confusion.  States there was slight numbness on the left side of her head potentially.  States she feels better.  Tried to get her primary care doctor to be seen in the office but it took over 3 hours to be called back and they told her to come into the ER.  Has not had episodes like this before.  She is been doing well the last couple days.  No history of atrial fibrillation. Past Medical History:  Diagnosis Date  . Hypercholesterolemia   . Thyroid disease     There are no active problems to display for this patient.   Past Surgical History:  Procedure Laterality Date  . CHOLECYSTECTOMY      OB History    No data available       Home Medications    Prior to Admission medications   Medication Sig Start Date End Date Taking? Authorizing Provider  ALPRAZolam (XANAX) 0.25 MG tablet Take 0.25 mg by mouth at bedtime as needed.   Yes [provider]  CRANBERRY PO Take 2 tablets by mouth daily.   Yes [provider]  cyclobenzaprine (FLEXERIL) 10 MG tablet Take 10 mg by mouth 3 (three) times daily as needed for muscle spasms.   Yes [provider]  ESTRACE VAGINAL 0.1 MG/GM vaginal cream Place 1 g vaginally 2 (two) times a week. Thursday & Sunday 08/10/16  Yes [provider]  hydrOXYzine (ATARAX/VISTARIL) 25 MG tablet Take 25 mg by mouth at bedtime. 09/23/17  Yes [provider]  ibuprofen (ADVIL,MOTRIN) 200 MG  tablet Take 600 mg by mouth every 6 (six) hours as needed.   Yes [provider]  levothyroxine (SYNTHROID, LEVOTHROID) 75 MCG tablet Take 75 mcg by mouth daily before breakfast.   Yes [provider]  Multiple Vitamins-Minerals (CENTRUM SILVER ULTRA WOMENS PO) Take 1 tablet by mouth daily.   Yes [provider]  rosuvastatin (CRESTOR) 5 MG tablet Take 5 mg by mouth daily.   Yes [provider]  saccharomyces boulardii (FLORASTOR) 250 MG capsule Take 250 mg by mouth 2 (two) times daily.   Yes [provider]  alendronate (FOSAMAX) 70 MG tablet Take 70 mg by mouth once a week. Take with a full glass of water on an empty stomach.    [provider]    Family History No family history on file.  Social History Social History   Tobacco Use  . Smoking status: Never Smoker  . Smokeless tobacco: Never Used  Substance Use Topics  . Alcohol use: No  . Drug use: No     Allergies   Ceftin [cefuroxime axetil]   Review of Systems Review of Systems  Constitutional: Negative for appetite change, fatigue and fever.  Respiratory: Negative for chest tightness and shortness of breath.   Cardiovascular: Negative for chest pain.  Gastrointestinal: Negative for abdominal pain.  Genitourinary: Negative for  flank pain.  Musculoskeletal: Negative for back pain.  Skin: Negative for rash.  Neurological: Positive for speech difficulty. Negative for light-headedness.  Psychiatric/Behavioral: Negative for agitation and behavioral problems.     Physical Exam Updated Vital Signs BP (!) 165/75 (BP Location: Right Arm)   Pulse 67   Temp 97.9 F (36.6 C) (Oral)   Resp 12   Ht 4\' 11"  (1.499 m)   Wt 55.8 kg (123 lb)   SpO2 100%   BMI 24.84 kg/m   Physical Exam  Constitutional: She is oriented to person, place, and time. She appears well-developed.  HENT:  Head: Atraumatic.  Eyes: EOM are normal.  Neck: Neck supple.  Cardiovascular: Normal  rate.  Pulmonary/Chest: Effort normal.  Abdominal: Soft.  Musculoskeletal: She exhibits no edema.  Neurological: She is alert and oriented to person, place, and time. No cranial nerve deficit. Coordination normal.  Normal speech.  Normal ambulation.  Good grip strength bilaterally.  Skin: Skin is warm. Capillary refill takes less than 2 seconds.  Psychiatric: She has a normal mood and affect.     ED Treatments / Results  Labs (all labs ordered are listed, but only abnormal results are displayed) Labs Reviewed  CBC - Abnormal; Notable for the following components:      Result Value   RBC 5.25 (*)    Hemoglobin 15.3 (*)    All other components within normal limits  COMPREHENSIVE METABOLIC PANEL - Abnormal; Notable for the following components:   Glucose, Bld 120 (*)    Total Protein 8.2 (*)    Total Bilirubin 1.3 (*)    All other components within normal limits  URINALYSIS, ROUTINE W REFLEX MICROSCOPIC - Abnormal; Notable for the following components:   Color, Urine STRAW (*)    All other components within normal limits  I-STAT CHEM 8, ED - Abnormal; Notable for the following components:   Glucose, Bld 118 (*)    All other components within normal limits  ETHANOL  PROTIME-INR  APTT  DIFFERENTIAL  RAPID URINE DRUG SCREEN, HOSP PERFORMED  I-STAT TROPONIN, ED    EKG  EKG Interpretation  Date/Time:  Friday October 17 2017 17:25:07 EST Ventricular Rate:  76 PR Interval:    QRS Duration: 89 QT Interval:  381 QTC Calculation: 429 R Axis:   59 Text Interpretation:  Sinus rhythm Confirmed by Benjiman Core [phone]) on 10/17/2017 7:46:33 PM       Radiology Dg Chest 2 View  Result Date: 10/17/2017 CLINICAL DATA:  Slurred speech. EXAM: CHEST  2 VIEW COMPARISON:  None. FINDINGS: The heart size and mediastinal contours are within normal limits. Both lungs are clear. No pneumothorax or pleural effusion is noted. The visualized skeletal structures are unremarkable. IMPRESSION:  No active cardiopulmonary disease. Electronically Signed   By: Lupita Raider, M.D.   On: 10/17/2017 19:35   Ct Head Wo Contrast  Result Date: 10/17/2017 CLINICAL DATA:  Speech difficulty EXAM: CT HEAD WITHOUT CONTRAST TECHNIQUE: Contiguous axial images were obtained from the base of the skull through the vertex without intravenous contrast. COMPARISON:  MRI 09/29/2014 FINDINGS: Brain: Mild age related volume loss. Mild chronic small vessel disease throughout the deep white matter. No acute intracranial abnormality. Specifically, no hemorrhage, hydrocephalus, mass lesion, acute infarction, or significant intracranial injury. Vascular: No hyperdense vessel or unexpected calcification. Skull: No acute calvarial abnormality. Sinuses/Orbits: Visualized paranasal sinuses and mastoids clear. Orbital soft tissues unremarkable. Other: None IMPRESSION: No acute intracranial abnormality. Atrophy, chronic microvascular disease. Electronically Signed  By: Charlett Nose M.D.   On: 10/17/2017 18:05    Procedures Procedures (including critical care time)  Medications Ordered in ED Medications - No data to display   Initial Impression / Assessment and Plan / ED Course  I have reviewed the triage vital signs and the nursing notes.  Pertinent labs & imaging results that were available during my care of the patient were reviewed by me and considered in my medical decision making (see chart for details).     Patient presents with episode of a aphasia.  Now resolved.  Not associated with numbness or weakness.  Head CT and workup reassuring.  However will admit for further TIA workup.  Final Clinical Impressions(s) / ED Diagnoses   Final diagnoses:  Aphasia    ED Discharge Orders    None       Benjiman Core, MD 10/17/17 1946

## 2017-10-17 NOTE — ED Notes (Signed)
[facility] ,Sian ,RN . Pt. Is going to MRI now , will call [facility] once pt. Back from MRI.

## 2017-10-17 NOTE — ED Notes (Signed)
Stroke Swallow Screen completed at 1800, passed , by Janece CanterburyLogan V. RN.

## 2017-10-17 NOTE — H&P (Signed)
History and Physical    Olivia Estes ZOX:[id number] DOB: 26-Mar-1941 DOA: 10/17/2017  PCP: Patient, No Pcp Per  Patient coming from: Home.  Chief Complaint: Difficulty speaking.  HPI: Olivia Estes is a 77 y.o. female with history of hyperlipidemia and hypothyroidism presents to the ER after patient had a brief episode of difficulty speaking.  Patient states around 12 PM today patient was talking on the phone trying to wish breath today when she was not able to bring out words.  This lasted for around 10 minutes.  Patient states she knows what she wants to talk but was not able to bring it out.  Denies any weakness of the extremities or any difficulty swallowing or any visual symptoms.  Symptoms resolved without any intervention.  ED Course: In the ER CT of the head was unremarkable.  Patient appears nonfocal.  Admitted for TIA workup.  Review of Systems: As per HPI, rest all negative.   Past Medical History:  Diagnosis Date  . Hypercholesterolemia   . Thyroid disease     Past Surgical History:  Procedure Laterality Date  . CHOLECYSTECTOMY       reports that she has quit smoking. she has never used smokeless tobacco. She reports that she does not drink alcohol or use drugs.  Allergies  Allergen Reactions  . Ceftin [Cefuroxime Axetil] Rash    Family History  Problem Relation Age of Onset  . Diabetes Mellitus II Mother   . Hypertension Father     Prior to Admission medications   Medication Sig Start Date End Date Taking? Authorizing Provider  ALPRAZolam (XANAX) 0.25 MG tablet Take 0.25 mg by mouth at bedtime as needed.   Yes [provider]  CRANBERRY PO Take 2 tablets by mouth daily.   Yes [provider]  cyclobenzaprine (FLEXERIL) 10 MG tablet Take 10 mg by mouth 3 (three) times daily as needed for muscle spasms.   Yes [provider]  ESTRACE VAGINAL 0.1 MG/GM vaginal cream Place 1 g vaginally 2 (two) times a week. Thursday & Sunday  08/10/16  Yes [provider]  hydrOXYzine (ATARAX/VISTARIL) 25 MG tablet Take 25 mg by mouth at bedtime. 09/23/17  Yes [provider]  ibuprofen (ADVIL,MOTRIN) 200 MG tablet Take 600 mg by mouth every 6 (six) hours as needed.   Yes [provider]  levothyroxine (SYNTHROID, LEVOTHROID) 75 MCG tablet Take 75 mcg by mouth daily before breakfast.   Yes [provider]  Multiple Vitamins-Minerals (CENTRUM SILVER ULTRA WOMENS PO) Take 1 tablet by mouth daily.   Yes [provider]  rosuvastatin (CRESTOR) 5 MG tablet Take 5 mg by mouth daily.   Yes [provider]  saccharomyces boulardii (FLORASTOR) 250 MG capsule Take 250 mg by mouth 2 (two) times daily.   Yes [provider]  alendronate (FOSAMAX) 70 MG tablet Take 70 mg by mouth once a week. Take with a full glass of water on an empty stomach.    [provider]    Physical Exam: Vitals:   10/17/17 1830 10/17/17 1900 10/17/17 1906 10/17/17 1930  BP: (!) 168/91 (!) 165/75 (!) 165/75   Pulse: 73 69 67 68  Resp: 13 14 12 16   Temp:      TempSrc:      SpO2: 99% 92% 100% 99%  Weight:      Height:          Constitutional: Moderately built and nourished. Vitals:   10/17/17 1830 10/17/17 1900 10/17/17  1906 10/17/17 1930  BP: (!) 168/91 (!) 165/75 (!) 165/75   Pulse: 73 69 67 68  Resp: 13 14 12 16   Temp:      TempSrc:      SpO2: 99% 92% 100% 99%  Weight:      Height:       Eyes: Anicteric no pallor. ENMT: No discharge from the ears eyes nose or mouth. Neck: No mass felt.  No neck rigidity.  No carotid bruit appreciated. Respiratory: No rhonchi or crepitations. Cardiovascular: S1-S2 heard no murmurs appreciated. Abdomen: Soft nontender bowel sounds present. Musculoskeletal: No edema.  No joint effusion. Skin: No rash.  Skin appears warm. Neurologic: Alert awake oriented to time place and person.  Moves all extremities 5 x 5.  No facial asymmetry tongue is midline.   Pupils are equal and reacting to light. Psychiatric: Appears normal.  Normal affect.   Labs on Admission: I have personally reviewed following labs and imaging studies  CBC: Recent Labs  Lab 10/17/17 1740 10/17/17 1756  WBC 7.9  --   NEUTROABS 5.1  --   HGB 15.3* 14.6  HCT 44.3 43.0  MCV 84.4  --   PLT 238  --    Basic Metabolic Panel: Recent Labs  Lab 10/17/17 1740 10/17/17 1756  NA 141 141  K 3.7 4.0  CL 105 103  CO2 26  --   GLUCOSE 120* 118*  BUN 14 13  CREATININE 0.62 0.70  CALCIUM 10.0  --    GFR: Estimated Creatinine Clearance: 43.4 mL/min (by C-G formula based on SCr of 0.7 mg/dL). Liver Function Tests: Recent Labs  Lab 10/17/17 1740  AST 25  ALT 18  ALKPHOS 51  BILITOT 1.3*  PROT 8.2*  ALBUMIN 5.0   No results for input(s): LIPASE, AMYLASE in the last 168 hours. No results for input(s): AMMONIA in the last 168 hours. Coagulation Profile: Recent Labs  Lab 10/17/17 1740  INR 0.99   Cardiac Enzymes: No results for input(s): CKTOTAL, CKMB, CKMBINDEX, TROPONINI in the last 168 hours. BNP (last 3 results) No results for input(s): PROBNP in the last 8760 hours. HbA1C: No results for input(s): HGBA1C in the last 72 hours. CBG: No results for input(s): GLUCAP in the last 168 hours. Lipid Profile: No results for input(s): CHOL, HDL, LDLCALC, TRIG, CHOLHDL, LDLDIRECT in the last 72 hours. Thyroid Function Tests: No results for input(s): TSH, T4TOTAL, FREET4, T3FREE, THYROIDAB in the last 72 hours. Anemia Panel: No results for input(s): VITAMINB12, FOLATE, FERRITIN, TIBC, IRON, RETICCTPCT in the last 72 hours. Urine analysis:    Component Value Date/Time   COLORURINE STRAW (A) 10/17/2017 1722   APPEARANCEUR CLEAR 10/17/2017 1722   LABSPEC 1.005 10/17/2017 1722   PHURINE 6.0 10/17/2017 1722   GLUCOSEU NEGATIVE 10/17/2017 1722   HGBUR NEGATIVE 10/17/2017 1722   BILIRUBINUR NEGATIVE 10/17/2017 1722   KETONESUR NEGATIVE 10/17/2017 1722    PROTEINUR NEGATIVE 10/17/2017 1722   NITRITE NEGATIVE 10/17/2017 1722   LEUKOCYTESUR NEGATIVE 10/17/2017 1722   Sepsis Labs: @LABRCNTIP (procalcitonin:4,lacticidven:4) )No results found for this or any previous visit (from the past 240 hour(s)).   Radiological Exams on Admission: Dg Chest 2 View  Result Date: 10/17/2017 CLINICAL DATA:  Slurred speech. EXAM: CHEST  2 VIEW COMPARISON:  None. FINDINGS: The heart size and mediastinal contours are within normal limits. Both lungs are clear. No pneumothorax or pleural effusion is noted. The visualized skeletal structures are unremarkable. IMPRESSION: No active cardiopulmonary disease. Electronically Signed   By: Fayrene Fearing  Christen ButterGreen Jr, M.D.   On: 10/17/2017 19:35   Ct Head Wo Contrast  Result Date: 10/17/2017 CLINICAL DATA:  Speech difficulty EXAM: CT HEAD WITHOUT CONTRAST TECHNIQUE: Contiguous axial images were obtained from the base of the skull through the vertex without intravenous contrast. COMPARISON:  MRI 09/29/2014 FINDINGS: Brain: Mild age related volume loss. Mild chronic small vessel disease throughout the deep white matter. No acute intracranial abnormality. Specifically, no hemorrhage, hydrocephalus, mass lesion, acute infarction, or significant intracranial injury. Vascular: No hyperdense vessel or unexpected calcification. Skull: No acute calvarial abnormality. Sinuses/Orbits: Visualized paranasal sinuses and mastoids clear. Orbital soft tissues unremarkable. Other: None IMPRESSION: No acute intracranial abnormality. Atrophy, chronic microvascular disease. Electronically Signed   By: Charlett NoseKevin  Dover M.D.   On: 10/17/2017 18:05    EKG: Independently reviewed.  Normal sinus rhythm.  Assessment/Plan Active Problems:   TIA (transient ischemic attack)   HLD (hyperlipidemia)   Hypothyroidism    1. TIA -patient symptoms are consistent with TIA.  Discussed with neurologist Dr. Otelia LimesLindzen.  [facility].   Patient agreeable to transfer.  Check MRI/MRA brain 2D echo carotid Doppler hemoglobin A1c and lipid panel.  Patient is on aspirin and statins. 2. Hyperlipidemia on statins. 3. Hypothyroidism on Synthroid. 4. Elevated blood pressure -closely follow blood pressure trends.   DVT prophylaxis: Lovenox. Code Status: Full code. Family Communication: Discussed with patient's daughter. Disposition Plan: Home. Consults called: Neurologist. Admission status: Observation.   Eduard ClosArshad N Rhythm Wigfall MD [facility] Pager [phone].  If 7PM-7AM, please contact night-coverage [email or web address] Password [email or web address]  10/17/2017, 9:31 PM

## 2017-10-17 NOTE — ED Notes (Signed)
Bed: WLPT1 Expected date:  Expected time:  Means of arrival:  Comments: 

## 2017-10-17 NOTE — ED Notes (Signed)
Patient transported to CT 

## 2017-10-17 NOTE — ED Triage Notes (Signed)
Patient states she had a 10 minute episode of slurred speech where she could not get her words out wright. Patient states she called her doctor at 1255 and called the physician again and was told  to come to the ED. No weakness of extremities. No slurred speech at this time.patient states she feels normal at this time.

## 2017-10-17 NOTE — ED Notes (Signed)
Bed: WA25 Expected date:  Expected time:  Means of arrival:  Comments: Hold for triage 1 

## 2017-10-18 ENCOUNTER — Observation Stay (HOSPITAL_COMMUNITY): Payer: Medicare Other

## 2017-10-18 ENCOUNTER — Observation Stay (HOSPITAL_BASED_OUTPATIENT_CLINIC_OR_DEPARTMENT_OTHER): Payer: Medicare Other

## 2017-10-18 DIAGNOSIS — R4701 Aphasia: Secondary | ICD-10-CM | POA: Diagnosis not present

## 2017-10-18 DIAGNOSIS — G459 Transient cerebral ischemic attack, unspecified: Secondary | ICD-10-CM

## 2017-10-18 DIAGNOSIS — R479 Unspecified speech disturbances: Secondary | ICD-10-CM | POA: Diagnosis not present

## 2017-10-18 LAB — ECHOCARDIOGRAM COMPLETE
AVLVOTPG: 9 mmHg
CHL CUP MV DEC (S): 285
EERAT: 8.25
EWDT: 285 ms
FS: 42 % (ref 28–44)
Height: 59 in
IV/PV OW: 1
LA diam end sys: 31 mm
LA vol: 29 mL
LADIAMINDEX: 2.03 cm/m2
LASIZE: 31 mm
LAVOLA4C: 25.9 mL
LAVOLIN: 19 mL/m2
LV E/e' medial: 8.25
LV e' LATERAL: 9.57 cm/s
LVEEAVG: 8.25
LVOT SV: 69 mL
LVOT VTI: 30.3 cm
LVOT area: 2.27 cm2
LVOT diameter: 17 mm
LVOT peak vel: 148 cm/s
Lateral S' vel: 15.1 cm/s
MV Peak grad: 2 mmHg
MVPKAVEL: 111 m/s
MVPKEVEL: 79 m/s
PW: 8 mm — AB (ref 0.6–1.1)
TAPSE: 23.3 mm
TDI e' lateral: 9.57
TDI e' medial: 6.31
Weight: 2045.87 oz

## 2017-10-18 MED ORDER — ASPIRIN 81 MG PO TABS: 81.0000 mg | ORAL_TABLET | Freq: Every day | ORAL | 0 refills | Status: DC

## 2017-10-18 NOTE — Care Management Obs Status (Signed)
MEDICARE OBSERVATION STATUS NOTIFICATION   Patient Details  Name: Olivia HolterMary Arzola MRN: [id number] Date of Birth: 11/27/1940   Medicare Observation Status Notification Given:  Yes    Leone Havenaylor, Jozee Hammer Clinton, RN 10/18/2017, 12:44 PM

## 2017-10-18 NOTE — Progress Notes (Signed)
PT Cancellation Note  Patient Details Name: Olivia Estes MRN: [id number] DOB: 05/26/1941   Cancelled Treatment:    Reason Eval/Treat Not Completed: Patient at procedure or test/unavailable. Pt currently being prepped for an EEG at bedside. PT will continue to f/u with pt as available.    Alessandra BevelsJennifer M Madeliene Tejera 10/18/2017, 10:33 AM

## 2017-10-18 NOTE — Progress Notes (Signed)
OT Cancellation Note  Patient Details Name: Olivia Estes MRN: [id number] DOB: 07/18/1941   Cancelled Treatment:    Reason Eval/Treat Not Completed: OT screened, no needs identified, will sign off. Pt able to complete all ADL in hospital setting at independent baseline at this time. She and her daughter report all symptoms resolved and no further concerns. MRI negative for acute abnormalities. No OT needs identified and OT will sign off.   Doristine Sectionharity A Vi Biddinger, MS OTR/L  Pager: [phone]   Doristine SectionCharity A Jemarcus Dougal 10/18/2017, 2:36 PM

## 2017-10-18 NOTE — Progress Notes (Signed)
VASCULAR LAB PRELIMINARY  PRELIMINARY  PRELIMINARY  PRELIMINARY  Carotid duplex completed.    Preliminary report:  1-39% ICA plaquing. Vertebral artery flow is antegrade.   Brentney Goldbach, RVT 10/18/2017, 12:35 PM

## 2017-10-18 NOTE — Progress Notes (Signed)
EEG completed, results pending. 

## 2017-10-18 NOTE — Progress Notes (Signed)
  Echocardiogram 2D Echocardiogram has been performed.  Olivia Estes, Olivia Estes 10/18/2017, 1:24 PM

## 2017-10-18 NOTE — Plan of Care (Addendum)
STROKE TEAM PROGRESS NOTE   HISTORY OF PRESENT ILLNESS (per record) Olivia Estes is an 77 y.o. female who presented to the [facility] ED on Friday afternoon after a 10 minute episode of slurred speech where she could not get her words out right. Her symptoms began at 1130 while speaking on the telephone. She noted that the words were "right in her head" but that they would not come out. She had no other Neurological symptoms, other than possibly some slight numbness to the left side of her head. She has not had a similar episode in the past. She called her doctor and was told to come to the ED. In Triage the patient was asymptomatic; there was no weakness of her extremities and no slurred speech. She has passed her stroke swallow screen (completed at 1800).   CT head revealed atrophy and mild chronic microvascular ischemic changes. No acute findings were seen.  EKG with NSR.  SUBJECTIVE (INTERVAL HISTORY) Pt not in room, off floor for testing.   OBJECTIVE Temp:  [97.6 F (36.4 C)-98.4 F (36.9 C)] 98.2 F (36.8 C) (02/02 0928) Pulse Rate:  [63-83] 71 (02/02 0928) Cardiac Rhythm: Normal sinus rhythm (02/02 0725) Resp:  [12-19] 19 (02/02 0928) BP: (104-192)/(60-114) 144/67 (02/02 0928) SpO2:  [92 %-100 %] 98 % (02/02 0928) Weight:  [123 lb (55.8 kg)-127 lb 13.9 oz (58 kg)] 127 lb 13.9 oz (58 kg) (02/01 2300)  CBC:  Recent Labs  Lab 10/17/17 1740 10/17/17 1756  WBC 7.9  --   NEUTROABS 5.1  --   HGB 15.3* 14.6  HCT 44.3 43.0  MCV 84.4  --   PLT 238  --     Basic Metabolic Panel:  Recent Labs  Lab 10/17/17 1740 10/17/17 1756  NA 141 141  K 3.7 4.0  CL 105 103  CO2 26  --   GLUCOSE 120* 118*  BUN 14 13  CREATININE 0.62 0.70  CALCIUM 10.0  --     Lipid Panel: No results found for: CHOL, TRIG, HDL, CHOLHDL, VLDL, LDLCALC HgbA1c: No results found for: HGBA1C Urine Drug Screen:     Component Value Date/Time   LABOPIA NONE DETECTED 10/17/2017 1722   COCAINSCRNUR NONE  DETECTED 10/17/2017 1722   LABBENZ NONE DETECTED 10/17/2017 1722   AMPHETMU NONE DETECTED 10/17/2017 1722   THCU NONE DETECTED 10/17/2017 1722   LABBARB NONE DETECTED 10/17/2017 1722    Alcohol Level     Component Value Date/Time   ETH <10 10/17/2017 1740    IMAGING  Dg Chest 2 View 10/17/2017 IMPRESSION:  No active cardiopulmonary disease.    Ct Head Wo Contrast 10/17/2017 IMPRESSION:  No acute intracranial abnormality. Atrophy, chronic microvascular disease.    Mr Olivia Estes Head Wo Contrast 10/18/2017 IMPRESSION:  MRI HEAD:  1. No acute intracranial process.  2. Moderate chronic small vessel ischemic disease.   MRA HEAD:  No emergent large vessel occlusion or flow limiting stenosis.    Transthoracic Echocardiogram  - Left ventricle: The cavity size was normal. Systolic function was   vigorous. The estimated ejection fraction was in the range of 65%   to 70%. There was dynamic obstruction. There was dynamic   obstruction, with a peak velocity of 250 cm/sec and a peak   gradient of 25 mm Hg. Wall motion was normal; there were no   regional wall motion abnormalities. Doppler parameters are   consistent with abnormal left ventricular relaxation (grade 1   diastolic dysfunction). Doppler parameters are consistent  with   indeterminate ventricular filling pressure. - Aortic valve: Transvalvular velocity was within the normal range.   There was no stenosis. There was no regurgitation. - Mitral valve: Transvalvular velocity was within the normal range.   There was no evidence for stenosis. There was no regurgitation. - Right ventricle: The cavity size was normal. Wall thickness was   normal. Systolic function was normal. - Atrial septum: No defect or patent foramen ovale was identified. - Tricuspid valve: There was trivial regurgitation. - Global longitudinal strain -15.5%  Bilateral Carotid Dopplers  Bilateral: 1-39% ICA stenosis. Vertebral artery flow is  antegrade.  EEG This is a normal EEG in the awake, drowsy, and sleep states without any epileptiform abnormalities noted.  A single normal EEG does not exclude the diagnosis of epilepsy.  Clinical correlation advised.     PHYSICAL EXAM Vitals:   10/18/17 0300 10/18/17 0500 10/18/17 0728 10/18/17 0928  BP: 104/62 129/60 136/61 (!) 144/67  Pulse: 63 63 65 71  Resp: 16 18 16 19   Temp: 97.6 F (36.4 C) 97.8 F (36.6 C) 97.8 F (36.6 C) 98.2 F (36.8 C)  TempSrc: Oral Oral Oral Oral  SpO2: 95% 99% 96% 98%  Weight:      Height:       Pt not seen  ASSESSMENT/PLAN Olivia Estes is a 77 y.o. female with history of hypercholesterolemia and hypothyroidism presenting with speech difficulties. She did not receive IV t-PA due to resolution of deficits.  Likely TIA    Resultant - resolution of deficits  CT head - No acute intracranial abnormality. Atrophy, chronic microvascular disease.   MRI head - No acute intracranial process.   MRA head - unremarkable  Carotid Doppler unremarkable  2D Echo EF 65-70%  EEG normal  Recommend 30 day cardiac event monitoring as outpt to rule out afib  LDL - pending  HgbA1c - pending  VTE prophylaxis - Lovenox Fall precautions Diet Heart Room service appropriate? Yes; Fluid consistency: Thin  No antithrombotic prior to admission, now on aspirin 325 mg daily.  Ongoing aggressive stroke risk factor management  Therapy recommendations:  pending  Disposition:  Pending  Hypertension  Stable  Permissive hypertension (OK if < 220/120) but gradually normalize in 5-7 days  Long-term BP goal normotensive  Hyperlipidemia  Home meds:  Crestor 5 mg daily resumed in hospital  LDL pending, goal < 70  Continue statin at discharge   Other Stroke Risk Factors  Advanced age  Former cigarette smoker - quit  Other Active Problems    Pt off floor for test during first round. On second round, pt has been discharged. However, I  did review all the images and discussed with Dr. Cena BentonVega on the hallway about her treatment plan.   She came in for short episode of word finding difficulty. So far stroke work up all negative, including MRI, MRA, EEG, CUS and TTE. LDL and A1C pending. Started ASA 325 and continued crestor. Recommend 30 day cardiac event monitoring as outpt to rule out afib.   No charge will be filed.   Hospital day # 0  Marvel PlanJindong Romeka Scifres, MD PhD Stroke Neurology 10/18/2017 5:15 PM    To contact Stroke Continuity provider, please refer to [email or web address]. After hours, contact General Neurology

## 2017-10-18 NOTE — Discharge Summary (Signed)
Physician Discharge Summary  Olivia Estes WUJ:[id number] DOB: September 08, 1941 DOA: 10/17/2017  PCP: Patient, No Pcp Per  Admit date: 10/17/2017 Discharge date: 10/18/2017  Time spent: > 35 minutes  Recommendations for Outpatient Follow-up:  1. Please see d/c instructions below 2. TIA work up negative, suggest holter monitor 3. F/u with echocardiogram results   Discharge Diagnoses:  Active Problems:   TIA (transient ischemic attack)   HLD (hyperlipidemia)   Hypothyroidism   Discharge Condition: stable  Diet recommendation: Heart healthy  Filed Weights   10/17/17 1646 10/17/17 2300  Weight: 55.8 kg (123 lb) 58 kg (127 lb 13.9 oz)    History of present illness:   77 y.o. female with history of hyperlipidemia and hypothyroidism presents to the ER after patient had a brief episode of difficulty speaking.  Patient states around 12 PM today patient was talking on the phone trying to wish breath today when she was not able to bring out words.  This lasted for around 10 minutes  Symptoms resolved and pt presented for TIA work up  Hospital Course:  TIA - work up negative - started on aspirin as per neuro recommendations - MRI of brain negative for stroke  Otherwise for known medical problems prior to admission continue prior to admission medication regimen listed below  Procedures:  TIA workup  Echocardiogram   EEG  Consultations:  Neurology  Discharge Exam: Vitals:   10/18/17 0928 10/18/17 1329  BP: (!) 144/67 (!) 129/57  Pulse: 71 80  Resp: 19 19  Temp: 98.2 F (36.8 C) 98 F (36.7 C)  SpO2: 98% 96%    General: Pt in nad, alert and awake Cardiovascular: rrr, no rubs Respiratory: no increased wob, no wheezes  Discharge Instructions   Discharge Instructions    Call MD for:  severe uncontrolled pain   Complete by:  As directed    Call MD for:  temperature >100.4   Complete by:  As directed    Diet - low sodium heart healthy   Complete by:  As directed     Discharge instructions   Complete by:  As directed    Please be sure to follow up with your primary care physician in the next 1 week or sooner. Ensure they go over echocardiogram results with you and set up holter monitoring   Increase activity slowly   Complete by:  As directed      Allergies as of 10/18/2017      Reactions   Ceftin [cefuroxime Axetil] Rash      Medication List    STOP taking these medications   ibuprofen 200 MG tablet Commonly known as:  ADVIL,MOTRIN     TAKE these medications   alendronate 70 MG tablet Commonly known as:  FOSAMAX Take 70 mg by mouth once a week. Take with a full glass of water on an empty stomach.   ALPRAZolam 0.25 MG tablet Commonly known as:  XANAX Take 0.25 mg by mouth at bedtime as needed.   aspirin 81 MG tablet Take 1 tablet (81 mg total) by mouth daily. Start taking on:  10/19/2017   CENTRUM SILVER ULTRA WOMENS PO Take 1 tablet by mouth daily.   CRANBERRY PO Take 2 tablets by mouth daily.   cyclobenzaprine 10 MG tablet Commonly known as:  FLEXERIL Take 10 mg by mouth 3 (three) times daily as needed for muscle spasms.   ESTRACE VAGINAL 0.1 MG/GM vaginal cream Generic drug:  estradiol Place 1 g vaginally 2 (two) times a  week. Thursday & Sunday   hydrOXYzine 25 MG tablet Commonly known as:  ATARAX/VISTARIL Take 25 mg by mouth at bedtime.   levothyroxine 75 MCG tablet Commonly known as:  SYNTHROID, LEVOTHROID Take 75 mcg by mouth daily before breakfast.   rosuvastatin 5 MG tablet Commonly known as:  CRESTOR Take 5 mg by mouth daily.   saccharomyces boulardii 250 MG capsule Commonly known as:  FLORASTOR Take 250 mg by mouth 2 (two) times daily.      Allergies  Allergen Reactions  . Ceftin [Cefuroxime Axetil] Rash      The results of significant diagnostics from this hospitalization (including imaging, microbiology, ancillary and laboratory) are listed below for reference.    Significant Diagnostic Studies: Dg  Chest 2 View  Result Date: 10/17/2017 CLINICAL DATA:  Slurred speech. EXAM: CHEST  2 VIEW COMPARISON:  None. FINDINGS: The heart size and mediastinal contours are within normal limits. Both lungs are clear. No pneumothorax or pleural effusion is noted. The visualized skeletal structures are unremarkable. IMPRESSION: No active cardiopulmonary disease. Electronically Signed   By: Lupita RaiderJames  Green Jr, M.D.   On: 10/17/2017 19:35   Ct Head Wo Contrast  Result Date: 10/17/2017 CLINICAL DATA:  Speech difficulty EXAM: CT HEAD WITHOUT CONTRAST TECHNIQUE: Contiguous axial images were obtained from the base of the skull through the vertex without intravenous contrast. COMPARISON:  MRI 09/29/2014 FINDINGS: Brain: Mild age related volume loss. Mild chronic small vessel disease throughout the deep white matter. No acute intracranial abnormality. Specifically, no hemorrhage, hydrocephalus, mass lesion, acute infarction, or significant intracranial injury. Vascular: No hyperdense vessel or unexpected calcification. Skull: No acute calvarial abnormality. Sinuses/Orbits: Visualized paranasal sinuses and mastoids clear. Orbital soft tissues unremarkable. Other: None IMPRESSION: No acute intracranial abnormality. Atrophy, chronic microvascular disease. Electronically Signed   By: Charlett NoseKevin  Dover M.D.   On: 10/17/2017 18:05   Mr Brain Wo Contrast  Result Date: 10/18/2017 CLINICAL DATA:  Altered mental status.  Suspect stroke. EXAM: MRI HEAD WITHOUT CONTRAST MRA HEAD WITHOUT CONTRAST TECHNIQUE: Multiplanar, multiecho pulse sequences of the brain and surrounding structures were obtained without intravenous contrast. Angiographic images of the head were obtained using MRA technique without contrast. COMPARISON:  CT HEAD October 17, 2017 and MRI of the head September 29, 2014 FINDINGS: MRI HEAD FINDINGS INTRACRANIAL CONTENTS: No reduced diffusion to suggest acute ischemia. No susceptibility artifact to suggest hemorrhage. The ventricles and  sulci are normal for patient's age. Patchy to confluent supratentorial white matter FLAIR T2 hyperintensities. No suspicious parenchymal signal, masses, mass effect. No abnormal extra-axial fluid collections. No extra-axial masses. VASCULAR: Normal major intracranial vascular flow voids present at skull base. SKULL AND UPPER CERVICAL SPINE: No abnormal sellar expansion. No suspicious calvarial bone marrow signal. Craniocervical junction maintained. SINUSES/ORBITS: The mastoid air-cells and included paranasal sinuses are well-aerated.The included ocular globes and orbital contents are non-suspicious. Status post bilateral ocular lens implants. OTHER: None. MRA HEAD FINDINGS ANTERIOR CIRCULATION: Normal flow related enhancement of the included cervical, petrous, cavernous and supraclinoid internal carotid arteries. LEFT internal carotid artery is developmentally larger. Patent anterior communicating artery. Patent anterior and middle cerebral arteries, including distal segments. No large vessel occlusion, flow limiting stenosis, aneurysm. POSTERIOR CIRCULATION: LEFT vertebral artery is dominant. Basilar artery is patent, with normal flow related enhancement of the main branch vessels. Patent posterior cerebral arteries. Robust LEFT posterior communicating artery present. No large vessel occlusion, flow limiting stenosis,  aneurysm. ANATOMIC VARIANTS: Aplastic RIGHT A1 segment. Source images and MIP images were reviewed. IMPRESSION: MRI  HEAD: 1. No acute intracranial process. 2. Moderate chronic small vessel ischemic disease. MRA HEAD: 1. No emergent large vessel occlusion or flow limiting stenosis. Electronically Signed   By: Awilda Metro M.D.   On: 10/18/2017 06:19   Mr Maxine Glenn Head Wo Contrast  Result Date: 10/18/2017 CLINICAL DATA:  Altered mental status.  Suspect stroke. EXAM: MRI HEAD WITHOUT CONTRAST MRA HEAD WITHOUT CONTRAST TECHNIQUE: Multiplanar, multiecho pulse sequences of the brain and surrounding  structures were obtained without intravenous contrast. Angiographic images of the head were obtained using MRA technique without contrast. COMPARISON:  CT HEAD October 17, 2017 and MRI of the head September 29, 2014 FINDINGS: MRI HEAD FINDINGS INTRACRANIAL CONTENTS: No reduced diffusion to suggest acute ischemia. No susceptibility artifact to suggest hemorrhage. The ventricles and sulci are normal for patient's age. Patchy to confluent supratentorial white matter FLAIR T2 hyperintensities. No suspicious parenchymal signal, masses, mass effect. No abnormal extra-axial fluid collections. No extra-axial masses. VASCULAR: Normal major intracranial vascular flow voids present at skull base. SKULL AND UPPER CERVICAL SPINE: No abnormal sellar expansion. No suspicious calvarial bone marrow signal. Craniocervical junction maintained. SINUSES/ORBITS: The mastoid air-cells and included paranasal sinuses are well-aerated.The included ocular globes and orbital contents are non-suspicious. Status post bilateral ocular lens implants. OTHER: None. MRA HEAD FINDINGS ANTERIOR CIRCULATION: Normal flow related enhancement of the included cervical, petrous, cavernous and supraclinoid internal carotid arteries. LEFT internal carotid artery is developmentally larger. Patent anterior communicating artery. Patent anterior and middle cerebral arteries, including distal segments. No large vessel occlusion, flow limiting stenosis, aneurysm. POSTERIOR CIRCULATION: LEFT vertebral artery is dominant. Basilar artery is patent, with normal flow related enhancement of the main branch vessels. Patent posterior cerebral arteries. Robust LEFT posterior communicating artery present. No large vessel occlusion, flow limiting stenosis,  aneurysm. ANATOMIC VARIANTS: Aplastic RIGHT A1 segment. Source images and MIP images were reviewed. IMPRESSION: MRI HEAD: 1. No acute intracranial process. 2. Moderate chronic small vessel ischemic disease. MRA HEAD: 1. No  emergent large vessel occlusion or flow limiting stenosis. Electronically Signed   By: Awilda Metro M.D.   On: 10/18/2017 06:19    Microbiology: No results found for this or any previous visit (from the past 240 hour(s)).   Labs: Basic Metabolic Panel: Recent Labs  Lab 10/17/17 1740 10/17/17 1756  NA 141 141  K 3.7 4.0  CL 105 103  CO2 26  --   GLUCOSE 120* 118*  BUN 14 13  CREATININE 0.62 0.70  CALCIUM 10.0  --    Liver Function Tests: Recent Labs  Lab 10/17/17 1740  AST 25  ALT 18  ALKPHOS 51  BILITOT 1.3*  PROT 8.2*  ALBUMIN 5.0   No results for input(s): LIPASE, AMYLASE in the last 168 hours. No results for input(s): AMMONIA in the last 168 hours. CBC: Recent Labs  Lab 10/17/17 1740 10/17/17 1756  WBC 7.9  --   NEUTROABS 5.1  --   HGB 15.3* 14.6  HCT 44.3 43.0  MCV 84.4  --   PLT 238  --    Cardiac Enzymes: No results for input(s): CKTOTAL, CKMB, CKMBINDEX, TROPONINI in the last 168 hours. BNP: BNP (last 3 results) No results for input(s): BNP in the last 8760 hours.  ProBNP (last 3 results) No results for input(s): PROBNP in the last 8760 hours.  CBG: No results for input(s): GLUCAP in the last 168 hours.     Signed:  Penny Pia MD.  [facility] 10/18/2017, 2:35 PM

## 2017-10-18 NOTE — Progress Notes (Signed)
SLP Cancellation Note  Patient Details Name: Olivia HolterMary Estes MRN: [id number] DOB: 07/08/1941   Cancelled treatment:       Reason Eval/Treat Not Completed: SLP screened, no needs identified, will sign off. MRI revealed no acute findings. Pt speech functional in mod complex conversation; she and her daughter report she is at her cognitive-linguistic baseline.   Rondel BatonMary Beth Seleena Reimers, [place], CCC-SLP Speech-Language Pathologist [phone]   Arlana LindauMary E Felisia Balcom 10/18/2017, 9:33 AM

## 2017-10-18 NOTE — Procedures (Signed)
ELECTROENCEPHALOGRAM REPORT  Date of Study: 10/18/17  Patient's Name: Olivia Estes MRN: [id number] Date of Birth: 07/24/1941  Referring Provider: Marvel PlanJindong Xu, MD  Clinical History: Olivia Estes is a 77 y.o. female [facility] after a 10 minute episode of slurred speech where she could not get her words out right. Her symptoms began at 1130 while speaking on the telephone. She noted that the words were "right in her head" but that they would not come out. She had no other Neurological symptoms, other than possibly some slight numbness to the left side of her head. She has not had a similar episode in the past. She called her doctor and was told to come to the ED. In Triage the patient was asymptomatic; there was no weakness of her extremities and no slurred speech. She has passed her stroke swallow screen (completed at 1800).  CT head revealed atrophy and mild chronic microvascular ischemic changes. No acute findings were seen.     Medications: Scheduled Meds: . aspirin  300 mg Rectal Daily   Or  . aspirin  325 mg Oral Daily  . enoxaparin (LOVENOX) injection  40 mg Subcutaneous Daily  . levothyroxine  75 mcg Oral QAC breakfast  . rosuvastatin  5 mg Oral Daily   Continuous Infusions: . sodium chloride 50 mL/hr at 10/17/17 2322   PRN Meds:.acetaminophen **OR** acetaminophen (TYLENOL) oral liquid 160 mg/5 mL **OR** acetaminophen, ALPRAZolam, cyclobenzaprine            Technical Summary: This is a standard 16 channel EEG recording performed according to the international 10-20 electrode system.  AP bipolar, transverse bipolar, and referential montages were obtained, and digitally reformatted as necessary.  Duration of Tracing: 21:28  Description: In the awake state there is a 10 Hz alpha rhythm seen from the posterior head regions in a symmetric fashion.  Drowsiness is noted by dropout of background alpha and stage 2 with POSTS.   Neither  hyperventilation or photic stimulation were performed.  EKG was monitored and noted to be sinus rhythym with an average heart rate of 84 bpm.  No epileptiform changes were noted.  Impression: This is a normal EEG in the awake, drowsy, and sleep states without any epileptiform abnormalities noted.  A single normal EEG does not exclude the diagnosis of epilepsy.  Clinical correlation advised.   Beryle Beamsobert Jullien Granquist, M.D. Neurology Cell [phone]) [phone]

## 2017-10-29 ENCOUNTER — Other Ambulatory Visit: Payer: Self-pay | Admitting: Family Medicine

## 2017-10-29 DIAGNOSIS — I4891 Unspecified atrial fibrillation: Secondary | ICD-10-CM

## 2017-10-29 DIAGNOSIS — G459 Transient cerebral ischemic attack, unspecified: Secondary | ICD-10-CM

## 2017-10-30 ENCOUNTER — Ambulatory Visit (INDEPENDENT_AMBULATORY_CARE_PROVIDER_SITE_OTHER): Payer: Medicare Other

## 2017-10-30 DIAGNOSIS — I4891 Unspecified atrial fibrillation: Secondary | ICD-10-CM | POA: Diagnosis not present

## 2017-10-30 DIAGNOSIS — G459 Transient cerebral ischemic attack, unspecified: Secondary | ICD-10-CM | POA: Diagnosis not present

## 2017-12-02 ENCOUNTER — Encounter: Payer: Self-pay | Admitting: *Deleted

## 2017-12-02 ENCOUNTER — Ambulatory Visit: Payer: Medicare Other | Admitting: Neurology

## 2017-12-02 ENCOUNTER — Encounter (INDEPENDENT_AMBULATORY_CARE_PROVIDER_SITE_OTHER): Payer: Self-pay

## 2017-12-02 ENCOUNTER — Encounter: Payer: Self-pay | Admitting: Neurology

## 2017-12-02 VITALS — BP 154/80 | HR 77 | Ht 59.0 in | Wt 127.0 lb

## 2017-12-02 DIAGNOSIS — G459 Transient cerebral ischemic attack, unspecified: Secondary | ICD-10-CM | POA: Diagnosis not present

## 2017-12-02 MED ORDER — ASPIRIN EC 325 MG PO TBEC: 325.0000 mg | DELAYED_RELEASE_TABLET | Freq: Every day | ORAL | 0 refills | Status: DC

## 2017-12-02 NOTE — Patient Instructions (Signed)
Transient Ischemic Attack A transient ischemic attack (TIA) is a "warning stroke" that causes stroke-like symptoms that then go away quickly. The symptoms of a TIA come on suddenly, and they last less than 24 hours. Unlike a stroke, a TIA does not cause permanent damage to the brain. It is important to know the symptoms of a TIA and what to do. Seek medical care right away, even if your symptoms go away. Having a TIA is a sign that you are at higher risk for a permanent stroke. Lifestyle changes and medical treatments can help prevent a stroke. What are the causes? This condition is caused by a temporary blockage in an artery in the head or neck. The blockage does not allow the brain to get the blood supply it needs and can cause various symptoms. The blockage can be caused by:  Fatty buildup in an artery in the head or neck (atherosclerosis).  A blood clot.  Tearing of an artery (dissection).  Inflammation of an artery (vasculitis).  Sometimes the cause is not known. What increases the risk? Certain factors may make you more likely to develop this condition. Some of these factors are things that you can change, such as:  Obesity.  Using products that contain nicotine or tobacco, such as cigarettes and e-cigarettes.  Taking oral birth control, especially if you also use tobacco.  Lack of physical inactivity.  Excessive use of alcohol.  Use of drugs, especially cocaine and methamphetamine.  Other risk factors include:  High blood pressure (hypertension).  High cholesterol.  Diabetes mellitus.  Heart disease (coronary artery disease).  Atrial fibrillation.  Being African American or Hispanic.  Being over the age of 60.  Being female.  Family history of stroke.  Previous history of blood clots, stroke, TIA, or heart attack.  Sickle cell disease.  Being a woman with a history of preeclampsia.  Migraine headache.  Sleep apnea.  Chronic inflammatory diseases, such  as rheumatoid arthritis or lupus.  Blood clotting disorders (hypercoagulable state).  What are the signs or symptoms? Symptoms of this condition are the same as those of a stroke, but they are temporary. The symptoms develop suddenly, and they go away quickly, usually within minutes to hours. Symptoms may include sudden:  Weakness or numbness in your face, arm, or leg, especially on one side of your body.  Trouble walking or difficulty moving your arms or legs.  Trouble speaking, understanding speech, or both (aphasia).  Vision changes in one or both eyes. These include double vision, blurred vision, or loss of vision.  Dizziness.  Confusion.  Loss of balance or coordination.  Nausea and vomiting.  Severe headache with no known cause.  If possible, make note of the exact time that you last felt like your normal self and what time your symptoms started. Tell your health care provider. How is this diagnosed? This condition may be diagnosed based on:  Your symptoms and medical history.  A physical exam.  Imaging tests, usually a CT or MRI scan of the brain.  Blood tests.  You may also have other tests, including:  Electrocardiogram (ECG).  Echocardiogram.  Carotid ultrasound.  A scan of the brain circulation (CT angiogram or MRI angiogram).  Continuous heart monitoring.  How is this treated? The goal of treatment is to reduce the risk for a subsequent stroke. Treatment may include stroke prevention therapies such as:  Changes to diet or lifestyle to decrease your risk. Lifestyle changes may include exercising and stopping smoking.    Medicines to thin the blood (antiplatelets or anticoagulants).  Blood pressure medicines.  Medicines to reduce cholesterol.  Treating other health conditions, such as diabetes or atrial fibrillation.  If testing shows that you have narrowing in the arteries to your brain, your health care provider may recommend a procedure, such  as:  Carotid endarterectomy. This is a surgery to remove the blockage from your artery.  Carotid angioplasty and stenting. This is a procedure to open or widen an artery in the neck using a metal mesh tube (stent). The stent helps keep the artery open by supporting the artery walls.  Follow these instructions at home: Medicines  Take over-the-counter and prescription medicines only as told by your health care provider.  If you were told to take a medicine to thin your blood, such as aspirin or an anticoagulant, take it exactly as told by your health care provider. ? Taking too much blood-thinning medicine can cause bleeding. ? If you do not take enough blood-thinning medicine, you will not have the protection that you need against a stroke and other problems. Eating and drinking  Eat 5 or more servings of fruits and vegetables each day.  Follow instructions from your health care provider about diet. You may need to follow a certain nutrition plan to help manage risk factors for stroke, such as high blood pressure, high cholesterol, diabetes, or obesity. This may include: ? Eating a low-fat, low-salt diet. ? Including a lot of fiber in your diet. ? Limiting the amount of carbohydrates and sugar in your diet.  Limit alcohol intake to no more than 1 drink a day for nonpregnant women and 2 drinks a day for men. One drink equals 12 oz of beer, 5 oz of wine, or 1 oz of hard liquor. General instructions  Maintain a healthy weight.  Stay physically active. Try to get at least 30 minutes of exercise on most or all days.  Find out if you have sleep apnea, and seek treatment if needed.  Do not use any products that contain nicotine or tobacco, such as cigarettes and e-cigarettes. If you need help quitting, ask your health care provider.  Do not abuse drugs.  Keep all follow-up visits as told by your health care provider. This is important. Where to find more information:  American Stroke  Association: www.strokeassociation.org  National Stroke Association: www.stroke.org Get help right away if:  You have chest pain or an irregular heartbeat.  You have any symptoms of stroke. The acronym BEFAST is an easy way to remember the main warning signs of stroke. ? B = Balance problems. Signs include dizziness, sudden trouble walking, or loss of balance. ? E = Eye problems. This includes trouble seeing or a sudden change in vision. ? F = Face changes. This includes sudden weakness or numbness of the face, or the face or eyelid drooping to one side. ? A = Arm weakness or numbness. This happens suddenly and usually on one side of the body. ? S = Speech problems. This includes trouble speaking or trouble understanding speech. ? T = Time. Time to call 911 or seek emergency care. Do not wait to see if symptoms will go away. Make note of the time your symptoms started.  Other signs of stroke may include: ? A sudden, severe headache with no known cause. ? Nausea or vomiting. ? Seizure. These symptoms may represent a serious problem that is an emergency. Do not wait to see if the symptoms will go away. Get   medical help right away. Call your local emergency services (911 in the [place] Do not drive yourself to the hospital. Summary  A TIA happens when an artery in the head or neck is blocked, leading to stroke-like symptoms that then go away quickly. The blockage clears before there is any permanent brain damage. A TIA is a medical emergency and requires immediate medical attention.  Symptoms of this condition are the same as those of a stroke, but they are temporary. The symptoms usually develop suddenly, and they go away quickly, usually within minutes to hours.  Having a TIA means that you are at high risk of a stroke in the near future.  Treatment may include medicines to thin the blood as well as medicines, diet changes, and lifestyle changes to manage conditions that increase the risk  of another TIA or a stroke. This information is not intended to replace advice given to you by your health care provider. Make sure you discuss any questions you have with your health care provider. Document Released: 06/12/2005 Document Revised: 10/15/2016 Document Reviewed: 10/15/2016 Elsevier Interactive Patient Education  2018 [employer]  

## 2017-12-02 NOTE — Progress Notes (Signed)
[facility]    Provider:  Dr Lucia Gaskins Referring Provider: Alysia Penna, MD Primary Care Physician:  Alysia Penna, MD  CC:  Episode of Aphasia  HPI:  Olivia Estes is a 77 y.o. female here as a referral from Dr. Link Snuffer for aphasia.  Past medical history of hyperlipidemia, hypothyroidism. On February first she called a friend to wish her a happy birthday, she was slurred, she couldn't talk, the word didn't come out right, she had utterances but they were not words "garbled".  It lasted a few minutes. It was acute and then abruptly stopped. No alteration of awareness or seizure-like activity. She went to [facility] and had a stroke evaluation. No residual deficits. No tpa was administered. No symptoms of sleep apnea. No falls. Daughter is here and provides much information as well. Symptoms resolved. No other focal neurologic deficits, associated symptoms, inciting events or modifiable factors.  Reviewed notes, labs and imaging from outside physicians, which showed:  Reviewed referring physician notes.  Patient presented with a brief episode of difficulty speaking which lasted about 10 minutes.  A TIA workup was negative.  Of note were recommended starting aspirin.  She had an echocardiogram with left ventricular ejection fraction 65-70%.  She is been quite anxious about this.  MRI of the brain was negative for stroke, stroke workup was negative, she was started on aspirin. Echocardiogram did not show thrombus or pfo. She also already had a 30-day monitor.   Reviewed MRI brain images and MRA head and agree with the following(reviewed with patient):  MRI HEAD:  1. No acute intracranial process. 2. Moderate chronic small vessel ischemic disease.  MRA HEAD:  1. No emergent large vessel occlusion or flow limiting stenosis.  Review of Systems: Patient complains of symptoms per HPI as well as the following symptoms: anxiety. Pertinent negatives and positives per  HPI. All others negative.   Social History   Socioeconomic History  . Marital status: Widowed    Spouse name: Not on file  . Number of children: 2  . Years of education: Not on file  . Highest education level: Bachelor's degree (e.g., BA, AB, BS)  Social Needs  . Financial resource strain: Not on file  . Food insecurity - worry: Not on file  . Food insecurity - inability: Not on file  . Transportation needs - medical: Not on file  . Transportation needs - non-medical: Not on file  Occupational History  . Occupation: retired Runner, broadcasting/film/video  Tobacco Use  . Smoking status: Former Smoker    Years: 20.00    Last attempt to quit: 1995    Years since quitting: 24.2  . Smokeless tobacco: Never Used  Substance and Sexual Activity  . Alcohol use: No  . Drug use: No  . Sexual activity: Not on file  Other Topics Concern  . Not on file  Social History Narrative   Lives at home with her daughter   Right handed   Drinks 1 cup of coffee in the morning    Family History  Problem Relation Age of Onset  . Diabetes Mellitus II Mother   . Heart attack Father     Past Medical History:  Diagnosis Date  . Hypercholesterolemia   . Mild anxiety   . Thyroid disease   . TIA (transient ischemic attack)     Past Surgical History:  Procedure Laterality Date  . CHOLECYSTECTOMY      Current Outpatient Medications  Medication Sig Dispense Refill  . ALPRAZolam Prudy Feeler)  0.25 MG tablet Take 0.25 mg by mouth at bedtime as needed.    Marland Kitchen. aspirin 81 MG tablet Take 1 tablet (81 mg total) by mouth daily. 30 tablet 0  . CRANBERRY PO Take 1 tablet by mouth daily.     . cyclobenzaprine (FLEXERIL) 10 MG tablet Take 10 mg by mouth 3 (three) times daily as needed for muscle spasms.    Andrey Campanile. ESTRACE VAGINAL 0.1 MG/GM vaginal cream Place 1 g vaginally 2 (two) times a week. Thursday & Sunday  11  . hydrOXYzine (ATARAX/VISTARIL) 25 MG tablet Take 25 mg by mouth at bedtime.  0  . levothyroxine (SYNTHROID, LEVOTHROID)  75 MCG tablet Take 75 mcg by mouth daily before breakfast.    . Multiple Vitamins-Minerals (CENTRUM SILVER ULTRA WOMENS PO) Take 1 tablet by mouth daily.    . rosuvastatin (CRESTOR) 5 MG tablet Take 5 mg by mouth daily.    Marland Kitchen. saccharomyces boulardii (FLORASTOR) 250 MG capsule Take 250 mg by mouth as needed.      No current facility-administered medications for this visit.     Allergies as of 12/02/2017 - Review Complete 12/02/2017  Allergen Reaction Noted  . Ceftin [cefuroxime axetil] Rash 09/16/2016    Vitals: BP (!) 154/80 (BP Location: Right Arm, Patient Position: Sitting)   Pulse 77   Ht 4\' 11"  (1.499 m)   Wt 127 lb (57.6 kg)   BMI 25.65 kg/m  Last Weight:  Wt Readings from Last 1 Encounters:  12/02/17 127 lb (57.6 kg)   Last Height:   Ht Readings from Last 1 Encounters:  12/02/17 4\' 11"  (1.499 m)    Physical exam: Exam: Gen: NAD, conversant, well nourised, well groomed                     CV: RRR, no MRG. No Carotid Bruits. No peripheral edema, warm, nontender Eyes: Conjunctivae clear without exudates or hemorrhage  Neuro: Detailed Neurologic Exam  Speech:    Speech is normal; fluent and spontaneous with normal comprehension.  Cognition:    The patient is oriented to person, place, and time;     recent and remote memory intact;     language fluent;     normal attention, concentration,     fund of knowledge Cranial Nerves:    The pupils are equal, round, and reactive to light. The fundi are normal and spontaneous venous pulsations are present. Visual fields are full to finger confrontation. Extraocular movements are intact. Trigeminal sensation is intact and the muscles of mastication are normal. The face is symmetric. The palate elevates in the midline. Hearing intact. Voice is normal. Shoulder shrug is normal. The tongue has normal motion without fasciculations.   Coordination:    Normal finger to nose and heel to shin. Normal rapid alternating movements.    Gait:    Heel-toe and tandem gait are normal.   Motor Observation:    No asymmetry, no atrophy, and no involuntary movements noted. Tone:    Normal muscle tone.    Posture:    Posture is normal. normal erect    Strength:    Strength is V/V in the upper and lower limbs.      Sensation: intact to LT     Reflex Exam:  DTR's:    Deep tendon reflexes in the upper and lower extremities are symmetrical bilaterally.   Toes:    The toes are equivocal bilaterally.   Clonus:    Clonus is absent.  Assessment/Plan:  23 77 year old female with a TIA  I had a long d/w patient about her recent stroke, risk for recurrent stroke/TIAs, personally independently reviewed imaging studies and stroke evaluation results and answered questions.ASA 325 for secondary stroke prevention and maintain strict control of blood pressure goal below 130/90, hemoglobin A1c goal below 6.5% and lipids with LDL cholesterol goal below 70 mg/dL. I also advised the patient to eat a healthy diet with plenty of whole grains, cereals, fruits and vegetables, exercise regularly and maintain ideal body weight .  Naomie Dean, MD  [facility] [address] [place], [place] [address]  Phone [phone] Fax [phone]

## 2017-12-03 ENCOUNTER — Encounter: Payer: Self-pay | Admitting: Neurology

## 2018-08-17 ENCOUNTER — Telehealth: Payer: Self-pay

## 2018-08-17 NOTE — Telephone Encounter (Signed)
Patient called and stated that she would like an appointment with Dr. Lucia GaskinsAhern in regards to her having a possible TIA.

## 2018-08-17 NOTE — Telephone Encounter (Signed)
Pt stated that she was out of town on [place] day 11/28th and had a short period of garbled speech "nothing came out exactly right". She stated that this was the same thing that happened before. This lasted about 5-10 minutes. She does not think she has had any residual effects (& also denied other neurological symptoms at time of event) and she would like to see Dr. Lucia GaskinsAhern about it. Pt's voices sounds clear on the phone and her words are fluent. Dr. Lucia GaskinsAhern aware. Pt scheduled for ov tomorrow 08/18/18 @ 08:00 AM arrival 07:30 with Dr.Ahern. Her questions were answered. Pt advised that if she has another episode to call 911 and go to a hospital for evaluation rather than waiting to see if her symptoms resolve. Pt cannot know if the event will be a TIA or stroke so it is better not to wait and see. Pt verbalized understanding and appreciation.

## 2018-08-18 ENCOUNTER — Telehealth: Payer: Self-pay | Admitting: Neurology

## 2018-08-18 ENCOUNTER — Ambulatory Visit: Payer: Medicare Other | Admitting: Neurology

## 2018-08-18 ENCOUNTER — Encounter: Payer: Self-pay | Admitting: Neurology

## 2018-08-18 VITALS — BP 143/91 | HR 80 | Ht 59.0 in | Wt 129.8 lb

## 2018-08-18 DIAGNOSIS — R4701 Aphasia: Secondary | ICD-10-CM | POA: Diagnosis not present

## 2018-08-18 DIAGNOSIS — G459 Transient cerebral ischemic attack, unspecified: Secondary | ICD-10-CM

## 2018-08-18 MED ORDER — CLOPIDOGREL BISULFATE 75 MG PO TABS
75.0000 mg | ORAL_TABLET | Freq: Every day | ORAL | 11 refills | Status: DC
Start: 2018-08-18 — End: 2019-08-16

## 2018-08-18 NOTE — Telephone Encounter (Signed)
[employer] medicare order sent to [facility]. No auth they will reach out to the pt to schedule.  °

## 2018-08-18 NOTE — Progress Notes (Signed)
[facility]    Provider:  Dr Lucia Gaskins Referring Provider: Alysia Penna, MD Primary Care Physician:  Alysia Penna, MD  CC:  Episode of Aphasia  Interval update 08/18/2018: Patient here for follup on TIA.  Patient was in [place] visiting her niece. She was talking to the kids, and she said "mirror" instead of mixer and couldn't get anything out, she couldn't figure out the oven. Lasted 5-10 minutes. Remembered the whole incident. Just like th first time it happened. Does not miss her aspirin. We had a heart monitor for 30 days, eeg was negative.   HPI:  Olivia Estes is a 77 y.o. female here as a referral from Dr. Link Snuffer for aphasia.  Past medical history of hyperlipidemia, hypothyroidism. On February first she called a friend to wish her a happy birthday, she was slurred, she couldn't talk, the word didn't come out right, she had utterances but they were not words "garbled".  It lasted a few minutes. It was acute and then abruptly stopped. No alteration of awareness or seizure-like activity. She went to [facility] and had a stroke evaluation. No residual deficits. No tpa was administered. No symptoms of sleep apnea. No falls. Daughter is here and provides much information as well. Symptoms resolved. No other focal neurologic deficits, associated symptoms, inciting events or modifiable factors.  Reviewed notes, labs and imaging from outside physicians, which showed:  Reviewed referring physician notes.  Patient presented with a brief episode of difficulty speaking which lasted about 10 minutes.  A TIA workup was negative.  Of note were recommended starting aspirin.  She had an echocardiogram with left ventricular ejection fraction 65-70%.  She is been quite anxious about this.  MRI of the brain was negative for stroke, stroke workup was negative, she was started on aspirin. Echocardiogram did not show thrombus or pfo. She also already had a 30-day monitor.   Reviewed MRI  brain images and MRA head and agree with the following(reviewed with patient):  MRI HEAD:  1. No acute intracranial process. 2. Moderate chronic small vessel ischemic disease.  MRA HEAD:  1. No emergent large vessel occlusion or flow limiting stenosis.  Review of Systems: Patient complains of symptoms per HPI as well as the following symptoms: anxiety. Pertinent negatives and positives per HPI. All others negative.   Social History   Socioeconomic History  . Marital status: Widowed    Spouse name: Not on file  . Number of children: 2  . Years of education: Not on file  . Highest education level: Bachelor's degree (e.g., BA, AB, BS)  Occupational History  . Occupation: retired Data processing manager  . Financial resource strain: Not on file  . Food insecurity:    Worry: Not on file    Inability: Not on file  . Transportation needs:    Medical: Not on file    Non-medical: Not on file  Tobacco Use  . Smoking status: Former Smoker    Years: 20.00    Last attempt to quit: 1995    Years since quitting: 24.9  . Smokeless tobacco: Never Used  Substance and Sexual Activity  . Alcohol use: No  . Drug use: No  . Sexual activity: Not on file  Lifestyle  . Physical activity:    Days per week: Not on file    Minutes per session: Not on file  . Stress: Not on file  Relationships  . Social connections:    Talks on phone: Not on file  Gets together: Not on file    Attends religious service: Not on file    Active member of club or organization: Not on file    Attends meetings of clubs or organizations: Not on file    Relationship status: Not on file  . Intimate partner violence:    Fear of current or ex partner: Not on file    Emotionally abused: Not on file    Physically abused: Not on file    Forced sexual activity: Not on file  Other Topics Concern  . Not on file  Social History Narrative   Lives at home with her daughter   Right handed   Drinks 1 cup of coffee  in the morning    Family History  Problem Relation Age of Onset  . Diabetes Mellitus II Mother   . Heart attack Father     Past Medical History:  Diagnosis Date  . Hypercholesterolemia   . Mild anxiety   . Thyroid disease   . TIA (transient ischemic attack)     Past Surgical History:  Procedure Laterality Date  . CHOLECYSTECTOMY      Current Outpatient Medications  Medication Sig Dispense Refill  . ALPRAZolam (XANAX) 0.25 MG tablet Take 0.25 mg by mouth at bedtime as needed.    Marland Kitchen CRANBERRY PO Take 1 tablet by mouth daily.     . cyclobenzaprine (FLEXERIL) 10 MG tablet Take 10 mg by mouth 3 (three) times daily as needed for muscle spasms.    Andrey Campanile VAGINAL 0.1 MG/GM vaginal cream Place 1 g vaginally 2 (two) times a week. Thursday & Sunday  11  . hydrOXYzine (ATARAX/VISTARIL) 25 MG tablet Take 25 mg by mouth at bedtime.  0  . levothyroxine (SYNTHROID, LEVOTHROID) 75 MCG tablet Take 75 mcg by mouth daily before breakfast.    . Multiple Vitamins-Minerals (CENTRUM SILVER ULTRA WOMENS PO) Take 1 tablet by mouth daily.    . rosuvastatin (CRESTOR) 5 MG tablet Take 5 mg by mouth daily.    Marland Kitchen saccharomyces boulardii (FLORASTOR) 250 MG capsule Take 250 mg by mouth as needed.     . clopidogrel (PLAVIX) 75 MG tablet Take 1 tablet (75 mg total) by mouth daily. 30 tablet 11   No current facility-administered medications for this visit.     Allergies as of 08/18/2018 - Review Complete 08/18/2018  Allergen Reaction Noted  . Ceftin [cefuroxime axetil] Rash 09/16/2016    Vitals: BP (!) 143/91   Pulse 80   Ht 4\' 11"  (1.499 m)   Wt 129 lb 12.8 oz (58.9 kg)   BMI 26.22 kg/m  Last Weight:  Wt Readings from Last 1 Encounters:  08/18/18 129 lb 12.8 oz (58.9 kg)   Last Height:   Ht Readings from Last 1 Encounters:  08/18/18 4\' 11"  (1.499 m)    Physical exam: Exam: Gen: NAD, conversant, well nourised, well groomed                     CV: RRR, no MRG. No Carotid Bruits. No  peripheral edema, warm, nontender Eyes: Conjunctivae clear without exudates or hemorrhage  Neuro: Detailed Neurologic Exam  Speech:    Speech is normal; fluent and spontaneous with normal comprehension.  Cognition:    The patient is oriented to person, place, and time;     recent and remote memory intact;     language fluent;     normal attention, concentration,     fund of knowledge Cranial Nerves:  The pupils are equal, round, and reactive to light. The fundi are normal and spontaneous venous pulsations are present. Visual fields are full to finger confrontation. Extraocular movements are intact. Trigeminal sensation is intact and the muscles of mastication are normal. The face is symmetric. The palate elevates in the midline. Hearing intact. Voice is normal. Shoulder shrug is normal. The tongue has normal motion without fasciculations.   Coordination:    Normal finger to nose and heel to shin. Normal rapid alternating movements.   Gait:    Heel-toe and tandem gait are normal.   Motor Observation:    No asymmetry, no atrophy, and no involuntary movements noted. Tone:    Normal muscle tone.    Posture:    Posture is normal. normal erect    Strength:    Strength is V/V in the upper and lower limbs.      Sensation: intact to LT     Reflex Exam:  DTR's:    Deep tendon reflexes in the upper and lower extremities are symmetrical bilaterally.   Toes:    The toes are equivocal bilaterally.   Clonus:    Clonus is absent.      Assessment/Plan:  67Lovely 77 year old female with repeat TIAs of aphasia. Stroke w/up in past negative. eeg and 30-day heart monitor were unremarkable. Recommend the following:  - change from Aspirin to plavix - repeat MRI brain - check lipid and hgba1c - Referral to Dr. Johney FrameAllred for eval of loop recorder  Orders Placed This Encounter  Procedures  . MR BRAIN WO CONTRAST  . Hemoglobin A1c  . Lipid panel  . Ambulatory referral to Cardiology    Meds ordered this encounter  Medications  . clopidogrel (PLAVIX) 75 MG tablet    Sig: Take 1 tablet (75 mg total) by mouth daily.    Dispense:  30 tablet    Refill:  11    I had a long d/w patient about her recent TIA, risk for recurrent stroke/TIAs, personally independently reviewed imaging studies and stroke evaluation results and answered questions.Plavix for stroke prevention and maintain strict control of blood pressure goal below 130/90, hemoglobin A1c goal below 6.5% and lipids with LDL cholesterol goal below 70 mg/dL. I also advised the patient to eat a healthy diet with plenty of whole grains, cereals, fruits and vegetables, exercise regularly and maintain ideal body weight .  A total of 49 minutes was spent face-to-face with this patient. Over half this time was spent on counseling patient on the  1. TIA (transient ischemic attack)   2. Aphasia     diagnosis and different diagnostic and therapeutic options, counseling and coordination of care, risks ans benefits of management, compliance, or risk factor reduction and education.    Naomie DeanAntonia Ahern, MD  [facility] [address] [place], [place] [id number]  Phone [phone] Fax [phone]

## 2018-08-18 NOTE — Patient Instructions (Addendum)
Dr. Johney Frame to discuss implantation of loop recorder for atrial fibbrillation Stop Aspirin. Start Plavix. Labs today Repeat MRI brain   Transient Ischemic Attack A transient ischemic attack (TIA) is a "warning stroke" that causes stroke-like symptoms that then go away quickly. The symptoms of a TIA come on suddenly, and they last less than 24 hours. Unlike a stroke, a TIA does not cause permanent damage to the brain. It is important to know the symptoms of a TIA and what to do. Seek medical care right away, even if your symptoms go away. Having a TIA is a sign that you are at higher risk for a permanent stroke. Lifestyle changes and medical treatments can help prevent a stroke. What are the causes? This condition is caused by a temporary blockage in an artery in the head or neck. The blockage does not allow the brain to get the blood supply it needs and can cause various symptoms. The blockage can be caused by:  Fatty buildup in an artery in the head or neck (atherosclerosis).  A blood clot.  Tearing of an artery (dissection).  Inflammation of an artery (vasculitis).  Sometimes the cause is not known. What increases the risk? Certain factors may make you more likely to develop this condition. Some of these factors are things that you can change, such as:  Obesity.  Using products that contain nicotine or tobacco, such as cigarettes and e-cigarettes.  Taking oral birth control, especially if you also use tobacco.  Lack of physical inactivity.  Excessive use of alcohol.  Use of drugs, especially cocaine and methamphetamine.  Other risk factors include:  High blood pressure (hypertension).  High cholesterol.  Diabetes mellitus.  Heart disease (coronary artery disease).  Atrial fibrillation.  Being African American or Hispanic.  Being over the age of 22.  Being female.  Family history of stroke.  Previous history of blood clots, stroke, TIA, or heart attack.  Sickle  cell disease.  Being a woman with a history of preeclampsia.  Migraine headache.  Sleep apnea.  Chronic inflammatory diseases, such as rheumatoid arthritis or lupus.  Blood clotting disorders (hypercoagulable state).  What are the signs or symptoms? Symptoms of this condition are the same as those of a stroke, but they are temporary. The symptoms develop suddenly, and they go away quickly, usually within minutes to hours. Symptoms may include sudden:  Weakness or numbness in your face, arm, or leg, especially on one side of your body.  Trouble walking or difficulty moving your arms or legs.  Trouble speaking, understanding speech, or both (aphasia).  Vision changes in one or both eyes. These include double vision, blurred vision, or loss of vision.  Dizziness.  Confusion.  Loss of balance or coordination.  Nausea and vomiting.  Severe headache with no known cause.  If possible, make note of the exact time that you last felt like your normal self and what time your symptoms started. Tell your health care provider. How is this diagnosed? This condition may be diagnosed based on:  Your symptoms and medical history.  A physical exam.  Imaging tests, usually a CT or MRI scan of the brain.  Blood tests.  You may also have other tests, including:  Electrocardiogram (ECG).  Echocardiogram.  Carotid ultrasound.  A scan of the brain circulation (CT angiogram or MRI angiogram).  Continuous heart monitoring.  How is this treated? The goal of treatment is to reduce the risk for a subsequent stroke. Treatment may include stroke prevention  therapies such as:  Changes to diet or lifestyle to decrease your risk. Lifestyle changes may include exercising and stopping smoking.  Medicines to thin the blood (antiplatelets or anticoagulants).  Blood pressure medicines.  Medicines to reduce cholesterol.  Treating other health conditions, such as diabetes or atrial  fibrillation.  If testing shows that you have narrowing in the arteries to your brain, your health care provider may recommend a procedure, such as:  Carotid endarterectomy. This is a surgery to remove the blockage from your artery.  Carotid angioplasty and stenting. This is a procedure to open or widen an artery in the neck using a metal mesh tube (stent). The stent helps keep the artery open by supporting the artery walls.  Follow these instructions at home: Medicines  Take over-the-counter and prescription medicines only as told by your health care provider.  If you were told to take a medicine to thin your blood, such as aspirin or an anticoagulant, take it exactly as told by your health care provider. ? Taking too much blood-thinning medicine can cause bleeding. ? If you do not take enough blood-thinning medicine, you will not have the protection that you need against a stroke and other problems. Eating and drinking  Eat 5 or more servings of fruits and vegetables each day.  Follow instructions from your health care provider about diet. You may need to follow a certain nutrition plan to help manage risk factors for stroke, such as high blood pressure, high cholesterol, diabetes, or obesity. This may include: ? Eating a low-fat, low-salt diet. ? Including a lot of fiber in your diet. ? Limiting the amount of carbohydrates and sugar in your diet.  Limit alcohol intake to no more than 1 drink a day for nonpregnant women and 2 drinks a day for men. One drink equals 12 oz of beer, 5 oz of wine, or 1 oz of hard liquor. General instructions  Maintain a healthy weight.  Stay physically active. Try to get at least 30 minutes of exercise on most or all days.  Find out if you have sleep apnea, and seek treatment if needed.  Do not use any products that contain nicotine or tobacco, such as cigarettes and e-cigarettes. If you need help quitting, ask your health care provider.  Do not  abuse drugs.  Keep all follow-up visits as told by your health care provider. This is important. Where to find more information:  American Stroke Association: www.strokeassociation.org  National Stroke Association: www.stroke.org Get help right away if:  You have chest pain or an irregular heartbeat.  You have any symptoms of stroke. The acronym BEFAST is an easy way to remember the main warning signs of stroke. ? B = Balance problems. Signs include dizziness, sudden trouble walking, or loss of balance. ? E = Eye problems. This includes trouble seeing or a sudden change in vision. ? F = Face changes. This includes sudden weakness or numbness of the face, or the face or eyelid drooping to one side. ? A = Arm weakness or numbness. This happens suddenly and usually on one side of the body. ? S = Speech problems. This includes trouble speaking or trouble understanding speech. ? T = Time. Time to call 911 or seek emergency care. Do not wait to see if symptoms will go away. Make note of the time your symptoms started.  Other signs of stroke may include: ? A sudden, severe headache with no known cause. ? Nausea or vomiting. ? Seizure. These  symptoms may represent a serious problem that is an emergency. Do not wait to see if the symptoms will go away. Get medical help right away. Call your local emergency services (911 in the [place] Do not drive yourself to the hospital. Summary  A TIA happens when an artery in the head or neck is blocked, leading to stroke-like symptoms that then go away quickly. The blockage clears before there is any permanent brain damage. A TIA is a medical emergency and requires immediate medical attention.  Symptoms of this condition are the same as those of a stroke, but they are temporary. The symptoms usually develop suddenly, and they go away quickly, usually within minutes to hours.  Having a TIA means that you are at high risk of a stroke in the near  future.  Treatment may include medicines to thin the blood as well as medicines, diet changes, and lifestyle changes to manage conditions that increase the risk of another TIA or a stroke. This information is not intended to replace advice given to you by your health care provider. Make sure you discuss any questions you have with your health care provider. Document Released: 06/12/2005 Document Revised: 10/15/2016 Document Reviewed: 10/15/2016 Elsevier Interactive Patient Education  2018 [employer]

## 2018-08-19 ENCOUNTER — Telehealth: Payer: Self-pay | Admitting: Neurology

## 2018-08-19 ENCOUNTER — Ambulatory Visit: Payer: Self-pay | Admitting: Neurology

## 2018-08-19 LAB — LIPID PANEL
Chol/HDL Ratio: 2.9 ratio (ref 0.0–4.4)
Cholesterol, Total: 215 mg/dL — ABNORMAL HIGH (ref 100–199)
HDL: 73 mg/dL (ref >39)
LDL Calculated: 111 mg/dL — ABNORMAL HIGH (ref 0–99)
TRIGLYCERIDES: 157 mg/dL — AB (ref 0–149)
VLDL Cholesterol Cal: 31 mg/dL (ref 5–40)

## 2018-08-19 LAB — HEMOGLOBIN A1C
ESTIMATED AVERAGE GLUCOSE: 140 mg/dL
HEMOGLOBIN A1C: 6.5 % — AB (ref 4.8–5.6)

## 2018-08-19 NOTE — Telephone Encounter (Signed)
Her HGBA1c is 6.5 which is in the diabetic range. She should follow up with her primary care for management. Her bad cholesterol is 111, I would like to increase her crestor to 10mg  (from 5mg ) to try and get her cholesterol LDL < 70. If she agrees let me know thanks

## 2018-08-20 ENCOUNTER — Other Ambulatory Visit: Payer: Self-pay | Admitting: Neurology

## 2018-08-20 MED ORDER — ROSUVASTATIN CALCIUM 10 MG PO TABS: 10.0000 mg | ORAL_TABLET | Freq: Every day | ORAL | 4 refills | Status: DC

## 2018-08-20 NOTE — Telephone Encounter (Signed)
Spoke with patient and discussed that her Hgb A1c is in the diabetic range. She should follow up with her PCP for management. Her bad cholesterol is 111. Dr. Lucia GaskinsAhern would like to increase her crestor to 10 mg (from 5 mg)to try to get her bad cholesterol (LDL) <70. Patient agreed and she would like for results to be sent to her PCP Dr. Link SnufferHolwerda. RN advised that she would send them. Pt appreciative.

## 2018-08-20 NOTE — Telephone Encounter (Signed)
Attempted to call pt. LVM asking for call back. Left office number in message. 

## 2018-08-20 NOTE — Telephone Encounter (Signed)
I already prescribed it thanks!

## 2018-08-21 NOTE — Telephone Encounter (Deleted)
Pt called back and confirmed message received.

## 2018-08-21 NOTE — Telephone Encounter (Signed)
Called pt's mobile # and LVM (ok per DPR) informing pt that Dr. Lucia GaskinsAhern prescribed the Crestor 10 mg daily and Dr. Trevor MaceAhern's [facility]. No call back required but if she has questions, office number was left in message along with office hours.

## 2018-08-21 NOTE — Telephone Encounter (Signed)
Pt returning RNs call, advised of what had been noted

## 2018-09-06 ENCOUNTER — Other Ambulatory Visit: Payer: Medicare Other

## 2018-09-07 ENCOUNTER — Ambulatory Visit
Admission: RE | Admit: 2018-09-07 | Discharge: 2018-09-07 | Disposition: A | Discharge status: Home / Self Care | Payer: Medicare Other | Source: Ambulatory Visit | Attending: Neurology | Admitting: Neurology

## 2018-09-07 ENCOUNTER — Encounter: Payer: Self-pay | Admitting: Internal Medicine

## 2018-09-07 ENCOUNTER — Ambulatory Visit: Payer: Medicare Other | Admitting: Internal Medicine

## 2018-09-07 VITALS — BP 134/70 | HR 83 | Ht 59.0 in | Wt 131.6 lb

## 2018-09-07 DIAGNOSIS — R4701 Aphasia: Secondary | ICD-10-CM

## 2018-09-07 DIAGNOSIS — G459 Transient cerebral ischemic attack, unspecified: Secondary | ICD-10-CM

## 2018-09-07 NOTE — Progress Notes (Signed)
ELECTROPHYSIOLOGY CONSULT NOTE  Patient ID: Olivia Estes MRN: [id number], DOB/AGE: 79/09/1940   Admit date: (Not on file) Date of Consult: 09/07/2018  Primary Physician: Alysia PennaHolwerda, Scott, MD   Reason for Consultation: Cryptogenic TIA; recommendations regarding Implantable Loop Recorder  History of Present Illness Olivia HolterMary Estes is referred by Dr Lucia GaskinsAhern for further evaluation of recurrent unexplained TIAs.  [facility] and was felt to have TIA.  [place] in [place] in [place] and had abrupt onset of confusion, lasting 5-10 minutes also felt to be due to TIA.   she has been monitored on telemetry which has demonstrated no arrhythmias. No cause has been identified.  EP has been asked to evaluate for placement of an implantable loop recorder to monitor for atrial fibrillation.   Past Medical History:  Diagnosis Date  . Hypercholesterolemia   . Mild anxiety   . Thyroid disease   . TIA (transient ischemic attack)      Past Surgical History:  Procedure Laterality Date  . CHOLECYSTECTOMY       Allergies  Allergen Reactions  . Ceftin [Cefuroxime Axetil] Rash    Medicines reviewed  Social History   Socioeconomic History  . Marital status: Widowed    Spouse name: Not on file  . Number of children: 2  . Years of education: Not on file  . Highest education level: Bachelor's degree (e.g., BA, AB, BS)  Occupational History  . Occupation: retired Data processing managerteacher  Social Needs  . Financial resource strain: Not on file  . Food insecurity:    Worry: Not on file    Inability: Not on file  . Transportation needs:    Medical: Not on file    Non-medical: Not on file  Tobacco Use  . Smoking status: Former Smoker    Years: 20.00    Last attempt to quit: 1995    Years since quitting: 24.9  . Smokeless tobacco: Never Used  Substance and Sexual Activity  . Alcohol use: No  . Drug use: No  . Sexual activity: Not on file  Lifestyle  . Physical  activity:    Days per week: Not on file    Minutes per session: Not on file  . Stress: Not on file  Relationships  . Social connections:    Talks on phone: Not on file    Gets together: Not on file    Attends religious service: Not on file    Active member of club or organization: Not on file    Attends meetings of clubs or organizations: Not on file    Relationship status: Not on file  . Intimate partner violence:    Fear of current or ex partner: Not on file    Emotionally abused: Not on file    Physically abused: Not on file    Forced sexual activity: Not on file  Other Topics Concern  . Not on file  Social History Narrative   Lives at home with her daughter   Right handed   Drinks 1 cup of coffee in the morning    Review of Systems General: No chills, fever, night sweats or weight changes  Cardiovascular:  No chest pain, dyspnea on exertion, edema, orthopnea, palpitations, paroxysmal nocturnal dyspnea Dermatological: No rash, lesions or masses Respiratory: No cough, dyspnea Urologic: No hematuria, dysuria Abdominal: No nausea, vomiting, diarrhea, bright red blood per rectum, melena, or hematemesis Neurologic: No visual changes, weakness, changes in mental status All other systems reviewed  and are otherwise negative except as noted above.  Physical Exam Blood pressure 134/70, pulse 83, height 4' 11" (1.499 m), weight 131 lb 9.6 oz (59.7 kg), SpO2 98 %.  General: Well developed, well appearing 77 y.o. female in no acute distress. HEENT: Normocephalic, atraumatic. EOMs intact. Sclera nonicteric. Oropharynx clear.  Neck: Supple without bruits. No JVD. Lungs: Respirations regular and unlabored, CTA bilaterally. No wheezes, rales or rhonchi. Heart: RRR. S1, S2 present. No murmurs, rub, S3 or S4. Abdomen: Soft, non-tender, non-distended. BS present x 4 quadrants. No hepatosplenomegaly.  Extremities: No clubbing, cyanosis or edema. DP/PT/Radials 2+ and equal bilaterally. Psych:  Normal affect. Neuro: Alert and oriented X 3. Moves all extremities spontaneously. Musculoskeletal: No kyphosis. Skin: Intact. Warm and dry. No rashes or petechiae in exposed areas.   Labs Lab Results  Component Value Date   WBC 7.9 10/17/2017   HGB 14.6 10/17/2017   HCT 43.0 10/17/2017   MCV 84.4 10/17/2017   PLT 238 10/17/2017   No results for input(s): NA, K, CL, CO2, BUN, CREATININE, CALCIUM, PROT, BILITOT, ALKPHOS, ALT, AST, GLUCOSE in the last 168 hours.  Invalid input(s): LABALBU No results for input(s): INR in the last 72 hours.   Echocardiogram  10/18/17 reveals EF 65% with dynamic obstruction (peak velocity 250 cm/sec and peak gradiet 25 mm Hg).  Normal LA size,  No LVH  12-lead ECG all ekgs in epic are reviewed and reveal sinus rhythm 30 day monitor Reveals only sinus rhythm, no afib   Assessment and Plan 1. Recurrent unexplainied TIA The patient has had TIAs of unknown cause.  2D echo is reviewed. Telemetry reveals no arrhythmias.  I agree with Dr Ahern that implantation of an insertion to monitor for AF is appropriate. The indication for loop recorder insertion / monitoring for AF in setting of cryptogenic stroke was discussed with the patient. The loop recorder insertion procedure was reviewed in detail including risks and benefits. These risks include but are not limited to bleeding and infection. The patient expressed verbal understanding and agrees to proceed. The patient was also counseled regarding wound care and device follow-up.  Signed, Shamikia Linskey MD, FACC 09/07/2018 9:24 AM    

## 2018-09-07 NOTE — H&P (View-Only) (Signed)
ELECTROPHYSIOLOGY CONSULT NOTE  Patient ID: Olivia Estes MRN: [id number], DOB/AGE: 79/09/1940   Admit date: (Not on file) Date of Consult: 09/07/2018  Primary Physician: Alysia PennaHolwerda, Scott, MD   Reason for Consultation: Cryptogenic TIA; recommendations regarding Implantable Loop Recorder  History of Present Illness Olivia HolterMary Abebe is referred by Dr Lucia GaskinsAhern for further evaluation of recurrent unexplained TIAs.  [facility] and was felt to have TIA.  She was visiting family in [place] in November and had abrupt onset of confusion, lasting 5-10 minutes also felt to be due to TIA.   she has been monitored on telemetry which has demonstrated no arrhythmias. No cause has been identified.  EP has been asked to evaluate for placement of an implantable loop recorder to monitor for atrial fibrillation.   Past Medical History:  Diagnosis Date  . Hypercholesterolemia   . Mild anxiety   . Thyroid disease   . TIA (transient ischemic attack)      Past Surgical History:  Procedure Laterality Date  . CHOLECYSTECTOMY       Allergies  Allergen Reactions  . Ceftin [Cefuroxime Axetil] Rash    Medicines reviewed  Social History   Socioeconomic History  . Marital status: Widowed    Spouse name: Not on file  . Number of children: 2  . Years of education: Not on file  . Highest education level: Bachelor's degree (e.g., BA, AB, BS)  Occupational History  . Occupation: retired Data processing managerteacher  Social Needs  . Financial resource strain: Not on file  . Food insecurity:    Worry: Not on file    Inability: Not on file  . Transportation needs:    Medical: Not on file    Non-medical: Not on file  Tobacco Use  . Smoking status: Former Smoker    Years: 20.00    Last attempt to quit: 1995    Years since quitting: 24.9  . Smokeless tobacco: Never Used  Substance and Sexual Activity  . Alcohol use: No  . Drug use: No  . Sexual activity: Not on file  Lifestyle  . Physical  activity:    Days per week: Not on file    Minutes per session: Not on file  . Stress: Not on file  Relationships  . Social connections:    Talks on phone: Not on file    Gets together: Not on file    Attends religious service: Not on file    Active member of club or organization: Not on file    Attends meetings of clubs or organizations: Not on file    Relationship status: Not on file  . Intimate partner violence:    Fear of current or ex partner: Not on file    Emotionally abused: Not on file    Physically abused: Not on file    Forced sexual activity: Not on file  Other Topics Concern  . Not on file  Social History Narrative   Lives at home with her daughter   Right handed   Drinks 1 cup of coffee in the morning    Review of Systems General: No chills, fever, night sweats or weight changes  Cardiovascular:  No chest pain, dyspnea on exertion, edema, orthopnea, palpitations, paroxysmal nocturnal dyspnea Dermatological: No rash, lesions or masses Respiratory: No cough, dyspnea Urologic: No hematuria, dysuria Abdominal: No nausea, vomiting, diarrhea, bright red blood per rectum, melena, or hematemesis Neurologic: No visual changes, weakness, changes in mental status All other systems reviewed  and are otherwise negative except as noted above.  Physical Exam Blood pressure 134/70, pulse 83, height 4\' 11"  (1.499 m), weight 131 lb 9.6 oz (59.7 kg), SpO2 98 %.  General: Well developed, well appearing 77 y.o. female in no acute distress. HEENT: Normocephalic, atraumatic. EOMs intact. Sclera nonicteric. Oropharynx clear.  Neck: Supple without bruits. No JVD. Lungs: Respirations regular and unlabored, CTA bilaterally. No wheezes, rales or rhonchi. Heart: RRR. S1, S2 present. No murmurs, rub, S3 or S4. Abdomen: Soft, non-tender, non-distended. BS present x 4 quadrants. No hepatosplenomegaly.  Extremities: No clubbing, cyanosis or edema. DP/PT/Radials 2+ and equal bilaterally. Psych:  Normal affect. Neuro: Alert and oriented X 3. Moves all extremities spontaneously. Musculoskeletal: No kyphosis. Skin: Intact. Warm and dry. No rashes or petechiae in exposed areas.   Labs Lab Results  Component Value Date   WBC 7.9 10/17/2017   HGB 14.6 10/17/2017   HCT 43.0 10/17/2017   MCV 84.4 10/17/2017   PLT 238 10/17/2017   No results for input(s): NA, K, CL, CO2, BUN, CREATININE, CALCIUM, PROT, BILITOT, ALKPHOS, ALT, AST, GLUCOSE in the last 168 hours.  Invalid input(s): LABALBU No results for input(s): INR in the last 72 hours.   Echocardiogram  10/18/17 reveals EF 65% with dynamic obstruction (peak velocity 250 cm/sec and peak gradiet 25 mm Hg).  Normal LA size,  No LVH  12-lead ECG all ekgs in epic are reviewed and reveal sinus rhythm 30 day monitor Reveals only sinus rhythm, no afib   Assessment and Plan 1. Recurrent unexplainied TIA The patient has had TIAs of unknown cause.  2D echo is reviewed. Telemetry reveals no arrhythmias.  I agree with Dr Lucia GaskinsAhern that implantation of an insertion to monitor for AF is appropriate. The indication for loop recorder insertion / monitoring for AF in setting of cryptogenic stroke was discussed with the patient. The loop recorder insertion procedure was reviewed in detail including risks and benefits. These risks include but are not limited to bleeding and infection. The patient expressed verbal understanding and agrees to proceed. The patient was also counseled regarding wound care and device follow-up.  Randolm IdolSigned, Esthefany Herrig MD, [facility] 09/07/2018 9:24 AM

## 2018-09-07 NOTE — Patient Instructions (Addendum)
Medication Instructions:  Your physician recommends that you continue on your current medications as directed. Please refer to the Current Medication list given to you today.  Labwork: None ordered.  Testing/Procedures:  Implantable Loop Recorder Placement  An implantable loop recorder is a small electronic device that is placed under the skin of your chest. It is about the size of an AA ("double A") battery. The device records the electrical activity of your heart over a long period of time. Your health care provider can download these recordings to monitor your heart. You may need an implantable loop recorder if you have periods of abnormal heart activity (arrhythmias) or unexplained fainting (syncope). The recorder can be left in place for 1 year or longer.   Follow-Up:  Your physician wants you to follow-up in: 7-10 days after your loop implant for a wound check with the device clinic.  Your physician wants you to follow-up in: as needed with Dr. Johney FrameAllred.       DIRECTIONS for LOOP IMPLANT:  Please arrive [facility] at:  8:00 am on September 10, 2018 You may eat before this procedure You may take your normal morning medications before this procedure You will be discharged after your procedure You may drive yourself home

## 2018-09-10 ENCOUNTER — Encounter (HOSPITAL_COMMUNITY): Payer: Self-pay | Admitting: Internal Medicine

## 2018-09-10 ENCOUNTER — Encounter (HOSPITAL_COMMUNITY)
Admission: RE | Disposition: A | Discharge status: Home / Self Care | Payer: Self-pay | Source: Home / Self Care | Attending: Internal Medicine

## 2018-09-10 ENCOUNTER — Ambulatory Visit (HOSPITAL_COMMUNITY)
Admission: RE | Admit: 2018-09-10 | Discharge: 2018-09-10 | Disposition: A | Discharge status: Home / Self Care | Payer: Medicare Other | Attending: Internal Medicine | Admitting: Internal Medicine

## 2018-09-10 ENCOUNTER — Other Ambulatory Visit: Payer: Self-pay

## 2018-09-10 ENCOUNTER — Telehealth: Payer: Self-pay

## 2018-09-10 DIAGNOSIS — Z87891 Personal history of nicotine dependence: Secondary | ICD-10-CM | POA: Diagnosis not present

## 2018-09-10 DIAGNOSIS — Z881 Allergy status to other antibiotic agents status: Secondary | ICD-10-CM | POA: Diagnosis not present

## 2018-09-10 DIAGNOSIS — E7800 Pure hypercholesterolemia, unspecified: Secondary | ICD-10-CM | POA: Insufficient documentation

## 2018-09-10 DIAGNOSIS — I639 Cerebral infarction, unspecified: Secondary | ICD-10-CM | POA: Diagnosis present

## 2018-09-10 DIAGNOSIS — I6389 Other cerebral infarction: Secondary | ICD-10-CM

## 2018-09-10 DIAGNOSIS — E079 Disorder of thyroid, unspecified: Secondary | ICD-10-CM | POA: Diagnosis not present

## 2018-09-10 HISTORY — PX: LOOP RECORDER INSERTION: EP1214

## 2018-09-10 SURGERY — LOOP RECORDER INSERTION

## 2018-09-10 MED ORDER — LIDOCAINE-EPINEPHRINE 1 %-1:100000 IJ SOLN
INTRAMUSCULAR | Status: AC
  Filled 2018-09-10: qty 1

## 2018-09-10 MED ORDER — LIDOCAINE-EPINEPHRINE 1 %-1:100000 IJ SOLN
INTRAMUSCULAR | Status: DC | PRN
  Administered 2018-09-10: 10 mL

## 2018-09-10 SURGICAL SUPPLY — 2 items
LOOP REVEAL LINQSYS (Prosthesis & Implant Heart) ×3 IMPLANT
PACK LOOP INSERTION (CUSTOM PROCEDURE TRAY) ×3 IMPLANT

## 2018-09-10 NOTE — Interval H&P Note (Signed)
History and Physical Interval Note:  09/10/2018 9:20 AM  Olivia Estes  has presented today for surgery, with the diagnosis of Stroke  The various methods of treatment have been discussed with the patient and family. After consideration of risks, benefits and other options for treatment, the patient has consented to  Procedure(s): LOOP RECORDER INSERTION (N/A) as a surgical intervention .  The patient's history has been reviewed, patient examined, no change in status, stable for surgery.  I have reviewed the patient's chart and labs.  Questions were answered to the patient's satisfaction.     Hillis RangeJames Mora Pedraza

## 2018-09-10 NOTE — Telephone Encounter (Signed)
-----   Message from Antonia B Ahern, MD sent at 09/09/2018  8:43 AM EST ----- MRI of the brain is unchanged from 10/2017 no reason for her symptoms seen. 

## 2018-09-10 NOTE — Interval H&P Note (Signed)
History and Physical Interval Note:  09/10/2018 9:18 AM  Olivia Estes  has presented today for surgery, with the diagnosis of Stroke  The various methods of treatment have been discussed with the patient and family. After consideration of risks, benefits and other options for treatment, the patient has consented to  Procedure(s): LOOP RECORDER INSERTION (N/A) as a surgical intervention .  The patient's history has been reviewed, patient examined, no change in status, stable for surgery.  I have reviewed the patient's chart and labs.  Questions were answered to the patient's satisfaction.     Hillis RangeJames Bharat Antillon

## 2018-09-10 NOTE — Telephone Encounter (Signed)
I called pt to discuss her MRI results. No answer, left a message asking her to call me back. 

## 2018-09-10 NOTE — Discharge Instructions (Signed)
Implant site/wound care instructions °Keep incision clean and dry for 3 days. °You can remove outer dressing tomorrow. °Leave steri-strips (little pieces of tape) on until seen in the office for wound check appointment. °Call the office ([phone]) for redness, drainage, swelling, or fever. ° °

## 2018-09-11 NOTE — Telephone Encounter (Signed)
Pt returned my call and I advised her of her MRI results. I reminded pt of her appt in March. Pt verbalized understanding of results. Pt had no questions at this time but was encouraged to call back if questions arise.

## 2018-09-14 ENCOUNTER — Telehealth: Payer: Self-pay | Admitting: *Deleted

## 2018-09-14 NOTE — Telephone Encounter (Signed)
Error

## 2018-09-14 NOTE — Telephone Encounter (Signed)
-----   Message from Anson FretAntonia B Ahern, MD sent at 09/09/2018  8:43 AM EST ----- MRI of the brain is unchanged from 10/2017 no reason for her symptoms seen.

## 2018-09-17 ENCOUNTER — Ambulatory Visit: Payer: Medicare Other

## 2018-09-18 ENCOUNTER — Ambulatory Visit (INDEPENDENT_AMBULATORY_CARE_PROVIDER_SITE_OTHER): Payer: Medicare Other | Admitting: *Deleted

## 2018-09-18 DIAGNOSIS — G459 Transient cerebral ischemic attack, unspecified: Secondary | ICD-10-CM

## 2018-09-18 NOTE — Progress Notes (Signed)
Wound Loop check in clinic.  Steri-strips removed. Wound without redness or edema. Incision edges approximated, wound well healed. Battery status: Good. R-waves  0.92 mV. 0 symptom episodes, 0 tachy episodes, Pause and brady off. 0 AF episodes. Monthly summary reports and ROV with JA PRN.

## 2018-09-29 ENCOUNTER — Ambulatory Visit: Payer: Medicare Other | Admitting: Neurology

## 2018-09-29 ENCOUNTER — Encounter: Payer: Self-pay | Admitting: Neurology

## 2018-09-29 DIAGNOSIS — R4789 Other speech disturbances: Secondary | ICD-10-CM | POA: Diagnosis not present

## 2018-09-29 NOTE — Progress Notes (Signed)
[facility]    Provider:  Dr Lucia Gaskins Referring Provider: Alysia Penna, MD Primary Care Physician:  Alysia Penna, MD  CC:  Episodes of Aphasia  Interval update 09/29/2018: She had an episode of dizziness last week. But none since. Discussed could be seizures. She had the loop implanted. No episodes since last being seen. Discussed seizures and causes of seizures, types of seizures. Routine eeg in the past normal but those are brief. Gave options, Discussed and decided on 3-day ambulatory eeg.    Interval update 08/18/2018: Patient here for follup on TIA.  Patient was in [place] visiting her niece. She was talking to the kids, and she said "mirror" instead of mixer and couldn't get anything out, she couldn't figure out the oven. Lasted 5-10 minutes. Remembered the whole incident. Just like th first time it happened. Does not miss her aspirin. We had a heart monitor for 30 days, eeg was negative.   HPI:  Olivia Estes is a 78 y.o. female here as a referral from Dr. Link Snuffer for aphasia.  Past medical history of hyperlipidemia, hypothyroidism. On February first she called a friend to wish her a happy birthday, she was slurred, she couldn't talk, the word didn't come out right, she had utterances but they were not words "garbled".  It lasted a few minutes. It was acute and then abruptly stopped. No alteration of awareness or seizure-like activity. She went to [place] [facility] and had a stroke evaluation. No residual deficits. No tpa was administered. No symptoms of sleep apnea. No falls. Daughter is here and provides much information as well. Symptoms resolved. No other focal neurologic deficits, associated symptoms, inciting events or modifiable factors.  Reviewed notes, labs and imaging from outside physicians, which showed:  Reviewed referring physician notes.  Patient presented with a brief episode of difficulty speaking which lasted about 10 minutes.  A TIA workup was  negative.  Of note were recommended starting aspirin.  She had an echocardiogram with left ventricular ejection fraction 65-70%.  She is been quite anxious about this.  MRI of the brain was negative for stroke, stroke workup was negative, she was started on aspirin. Echocardiogram did not show thrombus or pfo. She also already had a 30-day monitor.   Reviewed MRI brain images and MRA head and agree with the following(reviewed with patient):  MRI HEAD:  1. No acute intracranial process. 2. Moderate chronic small vessel ischemic disease.  MRA HEAD:  1. No emergent large vessel occlusion or flow limiting stenosis.  Review of Systems: Patient complains of symptoms per HPI as well as the following symptoms: anxiety. Pertinent negatives and positives per HPI. All others negative.   Social History   Socioeconomic History  . Marital status: Widowed    Spouse name: Not on file  . Number of children: 2  . Years of education: Not on file  . Highest education level: Bachelor's degree (e.g., BA, AB, BS)  Occupational History  . Occupation: retired Data processing manager  . Financial resource strain: Not on file  . Food insecurity:    Worry: Not on file    Inability: Not on file  . Transportation needs:    Medical: Not on file    Non-medical: Not on file  Tobacco Use  . Smoking status: Former Smoker    Years: 20.00    Last attempt to quit: 1995    Years since quitting: 25.0  . Smokeless tobacco: Never Used  Substance and Sexual Activity  .  Alcohol use: No  . Drug use: No  . Sexual activity: Not on file  Lifestyle  . Physical activity:    Days per week: Not on file    Minutes per session: Not on file  . Stress: Not on file  Relationships  . Social connections:    Talks on phone: Not on file    Gets together: Not on file    Attends religious service: Not on file    Active member of club or organization: Not on file    Attends meetings of clubs or organizations: Not on file      Relationship status: Not on file  . Intimate partner violence:    Fear of current or ex partner: Not on file    Emotionally abused: Not on file    Physically abused: Not on file    Forced sexual activity: Not on file  Other Topics Concern  . Not on file  Social History Narrative   Lives at home with her daughter   Right handed   Drinks 1 cup of coffee in the morning    Family History  Problem Relation Age of Onset  . Diabetes Mellitus II Mother   . Heart attack Father     Past Medical History:  Diagnosis Date  . Hypercholesterolemia   . Mild anxiety   . Thyroid disease   . TIA (transient ischemic attack)     Past Surgical History:  Procedure Laterality Date  . CHOLECYSTECTOMY    . LOOP RECORDER INSERTION N/A 09/10/2018   Procedure: LOOP RECORDER INSERTION;  Surgeon: Hillis Range, MD;  Location: [facility];  Service: Cardiovascular;  Laterality: N/A;    Current Outpatient Medications  Medication Sig Dispense Refill  . ALPRAZolam (XANAX) 0.25 MG tablet Take 0.25 mg by mouth at bedtime as needed for anxiety.     . cholecalciferol (VITAMIN D) 25 MCG (1000 UT) tablet Take 4,000 Units by mouth daily.     . clopidogrel (PLAVIX) 75 MG tablet Take 1 tablet (75 mg total) by mouth daily. 30 tablet 11  . CRANBERRY PO Take 1 tablet by mouth daily.     . cyclobenzaprine (FLEXERIL) 10 MG tablet Take 10 mg by mouth 3 (three) times daily as needed for muscle spasms.    Andrey Campanile VAGINAL 0.1 MG/GM vaginal cream Place 1 g vaginally 2 (two) times a week. Thursday & Sunday  11  . hydrOXYzine (ATARAX/VISTARIL) 25 MG tablet Take 25 mg by mouth at bedtime.  0  . levothyroxine (SYNTHROID, LEVOTHROID) 75 MCG tablet Take 75 mcg by mouth daily before breakfast.    . rosuvastatin (CRESTOR) 10 MG tablet Take 1 tablet (10 mg total) by mouth daily. 90 tablet 4  . Multiple Vitamins-Minerals (CENTRUM SILVER ULTRA WOMENS PO) Take 1 tablet by mouth daily.    Marland Kitchen saccharomyces boulardii  (FLORASTOR) 250 MG capsule Take 250 mg by mouth as needed (stomach symptoms).      No current facility-administered medications for this visit.     Allergies as of 09/29/2018 - Review Complete 09/29/2018  Allergen Reaction Noted  . Ceftin [cefuroxime axetil] Rash 09/16/2016    Vitals: BP (!) 154/77 (BP Location: Right Arm, Patient Position: Sitting)   Pulse 70   Ht 4\' 11"  (1.499 m)   Wt 129 lb (58.5 kg)   BMI 26.05 kg/m  Last Weight:  Wt Readings from Last 1 Encounters:  09/29/18 129 lb (58.5 kg)   Last Height:   Ht Readings from  Last 1 Encounters:  09/29/18 4\' 11"  (1.499 m)    Physical exam: Exam: Gen: NAD, conversant, well nourised, well groomed                     CV: RRR, no MRG. No Carotid Bruits. No peripheral edema, warm, nontender Eyes: Conjunctivae clear without exudates or hemorrhage  Neuro: Detailed Neurologic Exam  Speech:    Speech is normal; fluent and spontaneous with normal comprehension.  Cognition:    The patient is oriented to person, place, and time;     recent and remote memory intact;     language fluent;     normal attention, concentration,     fund of knowledge Cranial Nerves:    The pupils are equal, round, and reactive to light. The fundi are normal and spontaneous venous pulsations are present. Visual fields are full to finger confrontation. Extraocular movements are intact. Trigeminal sensation is intact and the muscles of mastication are normal. The face is symmetric. The palate elevates in the midline. Hearing intact. Voice is normal. Shoulder shrug is normal. The tongue has normal motion without fasciculations.   Coordination:    Normal finger to nose and heel to shin. Normal rapid alternating movements.   Gait:    Heel-toe and tandem gait are normal.   Motor Observation:    No asymmetry, no atrophy, and no involuntary movements noted. Tone:    Normal muscle tone.    Posture:    Posture is normal. normal erect    Strength:     Strength is V/V in the upper and lower limbs.      Sensation: intact to LT     Reflex Exam:  DTR's:    Deep tendon reflexes in the upper and lower extremities are symmetrical bilaterally.   Toes:    The toes are equivocal bilaterally.   Clonus:    Clonus is absent.      Assessment/Plan:  49Lovely 78 year old female with repeat TIAs of aphasia. Stroke w/up in past negative. eeg and 30-day heart monitor were unremarkable. Recommend the following:  - change from Aspirin to plavix: completed - repeat MRI brain: unremarkabe - check lipid and hgba1c: ldl 111, hgba1c 6.5 asked her to f/u with pcp for goal ldl < 70 and for treatment of diabetes - Referral to Dr. Johney FrameAllred for eval of loop recorder: was completed - 3-day ambulatory eeg ordered  I had a long d/w patient about her recent TIA, risk for recurrent stroke/TIAs, personally independently reviewed imaging studies and stroke evaluation results and answered questions.Plavix for stroke prevention and maintain strict control of blood pressure goal below 130/90, hemoglobin A1c goal below 6.5% and lipids with LDL cholesterol goal below 70 mg/dL. I also advised the patient to eat a healthy diet with plenty of whole grains, cereals, fruits and vegetables, exercise regularly and maintain ideal body weight .  A total of 25 minutes was spent face-to-face with this patient. Over half this time was spent on counseling patient on the  1. Episodes of speech arrest     diagnosis and different diagnostic and therapeutic options, counseling and coordination of care, risks ans benefits of management, compliance, or risk factor reduction and education.    Naomie DeanAntonia Valen Mascaro, MD  [facility] [address] [place], [place] [id number]  Phone [phone] Fax [phone]

## 2018-10-01 ENCOUNTER — Telehealth: Payer: Self-pay | Admitting: *Deleted

## 2018-10-01 NOTE — Telephone Encounter (Signed)
Referral for 72 hour AMB EEG faxed to [facility] @ [phone] per fax number given by [facility]. Received a receipt of confirmation.

## 2018-10-13 ENCOUNTER — Ambulatory Visit (INDEPENDENT_AMBULATORY_CARE_PROVIDER_SITE_OTHER): Payer: Medicare Other

## 2018-10-13 DIAGNOSIS — I639 Cerebral infarction, unspecified: Secondary | ICD-10-CM | POA: Diagnosis not present

## 2018-10-14 ENCOUNTER — Encounter: Payer: Self-pay | Admitting: Neurology

## 2018-10-14 NOTE — Progress Notes (Signed)
Carelink Summary Report / Loop Recorder 

## 2018-10-15 LAB — CUP PACEART REMOTE DEVICE CHECK
Date Time Interrogation Session: 20200128143533
MDC IDC PG IMPLANT DT: 20191226

## 2018-11-05 NOTE — Telephone Encounter (Signed)
72 hour EEG was normal per Dr. Lucia Gaskins. We were faxed the results from [facility].

## 2018-11-05 NOTE — Telephone Encounter (Signed)
I called pt & LVM (ok per DPR) informing pt that 72 hour EEG was normal. Left office number and hours in message for questions including current closure for inclement weather.

## 2018-11-15 LAB — CUP PACEART REMOTE DEVICE CHECK
Implantable Pulse Generator Implant Date: 20191226
MDC IDC SESS DTM: 20200301150956

## 2018-11-16 ENCOUNTER — Ambulatory Visit (INDEPENDENT_AMBULATORY_CARE_PROVIDER_SITE_OTHER): Payer: Medicare Other | Admitting: *Deleted

## 2018-11-16 DIAGNOSIS — I639 Cerebral infarction, unspecified: Secondary | ICD-10-CM

## 2018-11-23 NOTE — Progress Notes (Signed)
Carelink Summary Report / Loop Recorder 

## 2018-12-10 ENCOUNTER — Ambulatory Visit: Payer: Medicare Other | Admitting: Neurology

## 2018-12-17 ENCOUNTER — Ambulatory Visit: Payer: Medicare Other | Admitting: Neurology

## 2018-12-18 ENCOUNTER — Ambulatory Visit (INDEPENDENT_AMBULATORY_CARE_PROVIDER_SITE_OTHER): Payer: Medicare Other | Admitting: *Deleted

## 2018-12-18 ENCOUNTER — Other Ambulatory Visit: Payer: Self-pay

## 2018-12-18 DIAGNOSIS — I639 Cerebral infarction, unspecified: Secondary | ICD-10-CM

## 2018-12-18 LAB — CUP PACEART REMOTE DEVICE CHECK
Date Time Interrogation Session: 20200403144948
Implantable Pulse Generator Implant Date: 20191226

## 2018-12-24 NOTE — Progress Notes (Signed)
Carelink Summary Report / Loop Recorder 

## 2019-01-11 ENCOUNTER — Ambulatory Visit: Payer: Medicare Other | Admitting: Neurology

## 2019-01-20 ENCOUNTER — Ambulatory Visit (INDEPENDENT_AMBULATORY_CARE_PROVIDER_SITE_OTHER): Payer: Medicare Other | Admitting: *Deleted

## 2019-01-20 DIAGNOSIS — I639 Cerebral infarction, unspecified: Secondary | ICD-10-CM | POA: Diagnosis not present

## 2019-01-20 LAB — CUP PACEART REMOTE DEVICE CHECK
Date Time Interrogation Session: 20200506154821
Implantable Pulse Generator Implant Date: 20191226

## 2019-01-26 NOTE — Progress Notes (Signed)
Carelink Summary Report / Loop Recorder 

## 2019-02-22 ENCOUNTER — Ambulatory Visit (INDEPENDENT_AMBULATORY_CARE_PROVIDER_SITE_OTHER): Payer: Medicare Other | Admitting: *Deleted

## 2019-02-22 DIAGNOSIS — I639 Cerebral infarction, unspecified: Secondary | ICD-10-CM | POA: Diagnosis not present

## 2019-02-22 LAB — CUP PACEART REMOTE DEVICE CHECK
Date Time Interrogation Session: 20200608160811
Implantable Pulse Generator Implant Date: 20191226

## 2019-03-01 NOTE — Progress Notes (Signed)
Carelink Summary Report / Loop Recorder 

## 2019-03-25 ENCOUNTER — Ambulatory Visit: Payer: Self-pay | Admitting: Neurology

## 2019-03-29 ENCOUNTER — Ambulatory Visit (INDEPENDENT_AMBULATORY_CARE_PROVIDER_SITE_OTHER): Payer: Medicare Other | Admitting: *Deleted

## 2019-03-29 DIAGNOSIS — G459 Transient cerebral ischemic attack, unspecified: Secondary | ICD-10-CM | POA: Diagnosis not present

## 2019-03-29 LAB — CUP PACEART REMOTE DEVICE CHECK
Date Time Interrogation Session: 20200711160648
Implantable Pulse Generator Implant Date: 20191226

## 2019-04-07 NOTE — Progress Notes (Signed)
Carelink Summary Report / Loop Recorder 

## 2019-04-29 ENCOUNTER — Ambulatory Visit (INDEPENDENT_AMBULATORY_CARE_PROVIDER_SITE_OTHER): Payer: Medicare Other | Admitting: *Deleted

## 2019-04-29 DIAGNOSIS — G459 Transient cerebral ischemic attack, unspecified: Secondary | ICD-10-CM

## 2019-04-29 LAB — CUP PACEART REMOTE DEVICE CHECK
Date Time Interrogation Session: 20200813161152
Implantable Pulse Generator Implant Date: 20191226

## 2019-05-10 NOTE — Progress Notes (Signed)
Carelink Summary Report / Loop Recorder 

## 2019-05-17 ENCOUNTER — Encounter: Payer: Self-pay | Admitting: Neurology

## 2019-05-17 ENCOUNTER — Ambulatory Visit (INDEPENDENT_AMBULATORY_CARE_PROVIDER_SITE_OTHER): Payer: Medicare Other | Admitting: Neurology

## 2019-05-17 ENCOUNTER — Other Ambulatory Visit: Payer: Self-pay

## 2019-05-17 VITALS — BP 136/77 | HR 76 | Temp 98.4°F | Ht 60.0 in | Wt 129.0 lb

## 2019-05-17 DIAGNOSIS — G459 Transient cerebral ischemic attack, unspecified: Secondary | ICD-10-CM

## 2019-05-17 NOTE — Progress Notes (Signed)
[facility]    Provider:  Dr Jaynee Eagles Referring Provider: Velna Hatchet, MD Primary Care Physician:  Velna Hatchet, MD  CC:  Episodes of Aphasia  Interval update May 17, 2019: Patient is here for follow-up of TIA, she has had loop implanted in the past, routine EEG in the past normal as well as ambulatory 3-day EEG.  Here for follow-up. No more episodes. Nothing found on loop recorder to date. No more episodes.No dizziness. No falls. No recent migraines. No recent episodes of aphasia. She is doing yoga, gentle yoga.   Interval update 09/29/2018: She had an episode of dizziness last week. But none since. Discussed could be seizures. She had the loop implanted. No episodes since last being seen. Discussed seizures and causes of seizures, types of seizures. Routine eeg in the past normal but those are brief. Gave options, Discussed and decided on 3-day ambulatory eeg.    Interval update 08/18/2018: Patient here for follup on TIA.  Patient was in [place] visiting her niece. She was talking to the kids, and she said "mirror" instead of mixer and couldn't get anything out, she couldn't figure out the oven. Lasted 5-10 minutes. Remembered the whole incident. Just like th first time it happened. Does not miss her aspirin. We had a heart monitor for 30 days, eeg was negative.   HPI:  Olivia Estes is a 78 y.o. female here as a referral from Dr. Ardeth Perfect for aphasia.  Past medical history of hyperlipidemia, hypothyroidism. On February first she called a friend to wish her a happy birthday, she was slurred, she couldn't talk, the word didn't come out right, she had utterances but they were not words "garbled".  It lasted a few minutes. It was acute and then abruptly stopped. No alteration of awareness or seizure-like activity. She went to [place] long and had a stroke evaluation. No residual deficits. No tpa was administered. No symptoms of sleep apnea. No falls. Daughter is here and  provides much information as well. Symptoms resolved. No other focal neurologic deficits, associated symptoms, inciting events or modifiable factors.  Reviewed notes, labs and imaging from outside physicians, which showed:  Reviewed referring physician notes.  Patient presented with a brief episode of difficulty speaking which lasted about 10 minutes.  A TIA workup was negative.  Of note were recommended starting aspirin.  She had an echocardiogram with left ventricular ejection fraction 65-70%.  She is been quite anxious about this.  MRI of the brain was negative for stroke, stroke workup was negative, she was started on aspirin. Echocardiogram did not show thrombus or pfo. She also already had a 30-day monitor.   Reviewed MRI brain images and MRA head and agree with the following(reviewed with patient):  MRI HEAD:  1. No acute intracranial process. 2. Moderate chronic small vessel ischemic disease.  MRA HEAD:  1. No emergent large vessel occlusion or flow limiting stenosis.  Review of Systems: Patient complains of symptoms per HPI as well as the following symptoms: anxiety. Pertinent negatives and positives per HPI. All others negative.   Social History   Socioeconomic History  . Marital status: Widowed    Spouse name: Not on file  . Number of children: 2  . Years of education: Not on file  . Highest education level: Bachelor's degree (e.g., BA, AB, BS)  Occupational History  . Occupation: retired Tour manager  . Financial resource strain: Not on file  . Food insecurity    Worry: Not on  file    Inability: Not on file  . Transportation needs    Medical: Not on file    Non-medical: Not on file  Tobacco Use  . Smoking status: Former Smoker    Years: 20.00    Quit date: 1995    Years since quitting: 25.6  . Smokeless tobacco: Never Used  Substance and Sexual Activity  . Alcohol use: No  . Drug use: No  . Sexual activity: Not on file  Lifestyle  . Physical  activity    Days per week: Not on file    Minutes per session: Not on file  . Stress: Not on file  Relationships  . Social Musicianconnections    Talks on phone: Not on file    Gets together: Not on file    Attends religious service: Not on file    Active member of club or organization: Not on file    Attends meetings of clubs or organizations: Not on file    Relationship status: Not on file  . Intimate partner violence    Fear of current or ex partner: Not on file    Emotionally abused: Not on file    Physically abused: Not on file    Forced sexual activity: Not on file  Other Topics Concern  . Not on file  Social History Narrative   Lives at home with her daughter   Right handed   Drinks 1 cup of coffee in the morning    Family History  Problem Relation Age of Onset  . Diabetes Mellitus II Mother   . Heart attack Father     Past Medical History:  Diagnosis Date  . Hypercholesterolemia   . Mild anxiety   . Thyroid disease   . TIA (transient ischemic attack)     Past Surgical History:  Procedure Laterality Date  . CHOLECYSTECTOMY    . LOOP RECORDER INSERTION N/A 09/10/2018   Procedure: LOOP RECORDER INSERTION;  Surgeon: Hillis RangeAllred, James, MD;  Location: [facility];  Service: Cardiovascular;  Laterality: N/A;    Current Outpatient Medications  Medication Sig Dispense Refill  . ALPRAZolam (XANAX) 0.25 MG tablet Take 0.25 mg by mouth at bedtime as needed for anxiety.     . cholecalciferol (VITAMIN D) 25 MCG (1000 UT) tablet Take 4,000 Units by mouth daily.     . clopidogrel (PLAVIX) 75 MG tablet Take 1 tablet (75 mg total) by mouth daily. 30 tablet 11  . CRANBERRY PO Take 1 tablet by mouth daily.     . cyclobenzaprine (FLEXERIL) 10 MG tablet Take 10 mg by mouth 3 (three) times daily as needed for muscle spasms.    Andrey Campanile. ESTRACE VAGINAL 0.1 MG/GM vaginal cream Place 1 g vaginally 2 (two) times a week. Thursday & Sunday  11  . hydrOXYzine (ATARAX/VISTARIL) 25 MG tablet Take  25 mg by mouth at bedtime.  0  . levothyroxine (SYNTHROID, LEVOTHROID) 75 MCG tablet Take 75 mcg by mouth daily before breakfast.    . Multiple Vitamins-Minerals (CENTRUM SILVER ULTRA WOMENS PO) Take 1 tablet by mouth daily.    . rosuvastatin (CRESTOR) 10 MG tablet Take 1 tablet (10 mg total) by mouth daily. 90 tablet 4  . saccharomyces boulardii (FLORASTOR) 250 MG capsule Take 250 mg by mouth as needed (stomach symptoms).      No current facility-administered medications for this visit.     Allergies as of 05/17/2019 - Review Complete 09/29/2018  Allergen Reaction Noted  . Ceftin [cefuroxime  axetil] Rash 09/16/2016    Vitals: There were no vitals taken for this visit. Last Weight:  Wt Readings from Last 1 Encounters:  09/29/18 129 lb (58.5 kg)   Last Height:   Ht Readings from Last 1 Encounters:  09/29/18 4\' 11"  (1.499 m)    Physical exam: Exam: Gen: NAD, conversant, well nourised, well groomed                     CV: RRR, no MRG. No Carotid Bruits. No peripheral edema, warm, nontender Eyes: Conjunctivae clear without exudates or hemorrhage  Neuro: Detailed Neurologic Exam  Speech:    Speech is normal; fluent and spontaneous with normal comprehension.  Cognition:    The patient is oriented to person, place, and time;     recent and remote memory intact;     language fluent;     normal attention, concentration,     fund of knowledge Cranial Nerves:    The pupils are equal, round, and reactive to light. The fundi are normal and spontaneous venous pulsations are present. Visual fields are full to finger confrontation. Extraocular movements are intact. Trigeminal sensation is intact and the muscles of mastication are normal. The face is symmetric. The palate elevates in the midline. Hearing intact. Voice is normal. Shoulder shrug is normal. The tongue has normal motion without fasciculations.   Coordination:    Normal finger to nose and heel to shin. Normal rapid  alternating movements.   Gait:    Heel-toe and tandem gait are normal.   Motor Observation:    No asymmetry, no atrophy, and no involuntary movements noted. Tone:    Normal muscle tone.    Posture:    Posture is normal. normal erect    Strength:    Strength is V/V in the upper and lower limbs.      Sensation: intact to LT     Reflex Exam:  DTR's:    Deep tendon reflexes in the upper and lower extremities are symmetrical bilaterally.   Toes:    The toes are equivocal bilaterally.   Clonus:    Clonus is absent.      Assessment/Plan:  39Lovely 78 year old female with repeat TIAs of aphasia. Stroke w/up in past negative. eeg and 30-day heart monitor were unremarkable. Recommend the following:  - change from Aspirin to plavix: completed continue - repeat MRI brain: unremarkabe - check lipid and hgba1c: ldl 111, hgba1c 6.5 asked her to f/u with pcp for goal ldl < 70 and for treatment of diabetes - Referral to Dr. Johney FrameAllred for eval of loop recorder: was completed - 3-day ambulatory eeg ordered: no seizures or epileptiform activity.  - no more episodes, doing well, follow up as needed  I had a long d/w patient about her recent TIA, risk for recurrent stroke/TIAs, personally independently reviewed imaging studies and stroke evaluation results and answered questions.Plavix for stroke prevention and maintain strict control of blood pressure goal below 130/90, hemoglobin A1c goal below 6.5% and lipids with LDL cholesterol goal below 70 mg/dL. I also advised the patient to eat a healthy diet with plenty of whole grains, cereals, fruits and vegetables, exercise regularly and maintain ideal body weight .  A total of 25 minutes was spent face-to-face with this patient. Over half this time was spent on counseling patient on the  No diagnosis found.  diagnosis and different diagnostic and therapeutic options, counseling and coordination of care, risks ans benefits of management, compliance, or  risk factor reduction and education.    Naomie Dean, MD  [facility] [address] [place], [place] [address]  Phone [phone] Fax [phone]

## 2019-05-22 IMAGING — CT CT HEAD W/O CM
3 series · 16 of 47 positions shown, 19 images · non-contrast
Comparison: MRI 09/29/2014

CLINICAL DATA: Speech difficulty

EXAM:
CT HEAD WITHOUT CONTRAST
TECHNIQUE: Contiguous axial images were obtained from the base of the skull
through the vertex without intravenous contrast.

[Series 2: head wo · axial · 0.39mm/px · z∈[-195,-70]mm · 10 of 30 slices shown, 13 images]
[im 3/30  brain]
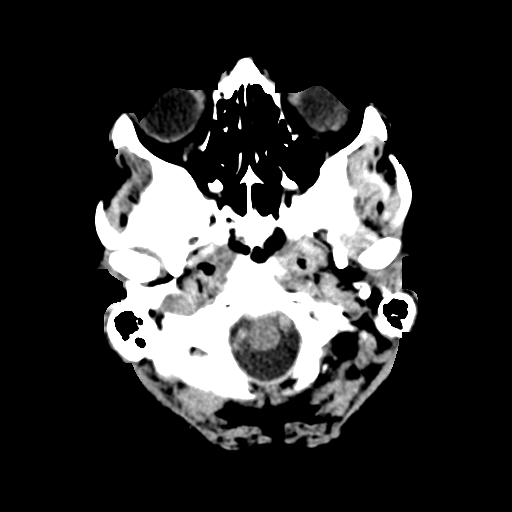
[im 3/30  bone]
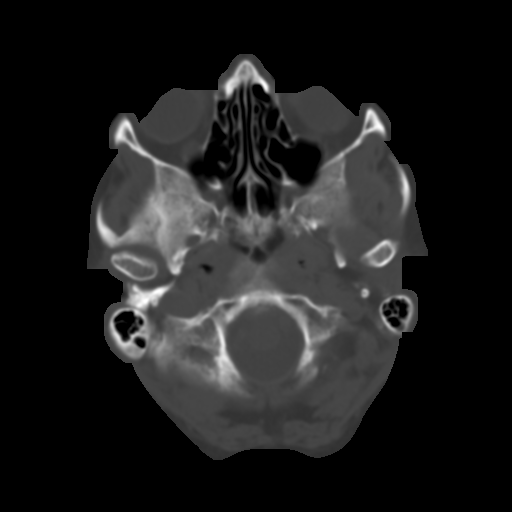
[im 6/30  brain]
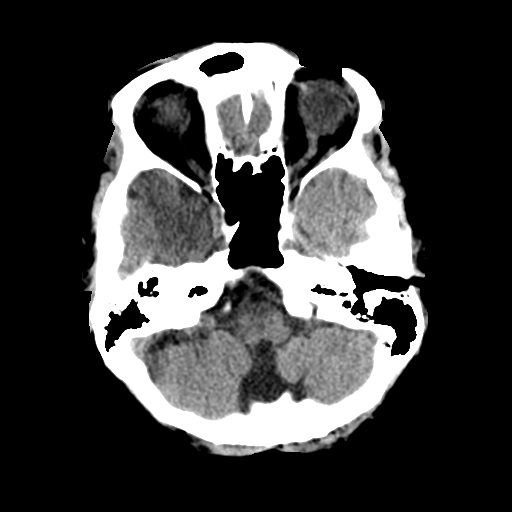
[im 9/30  brain]
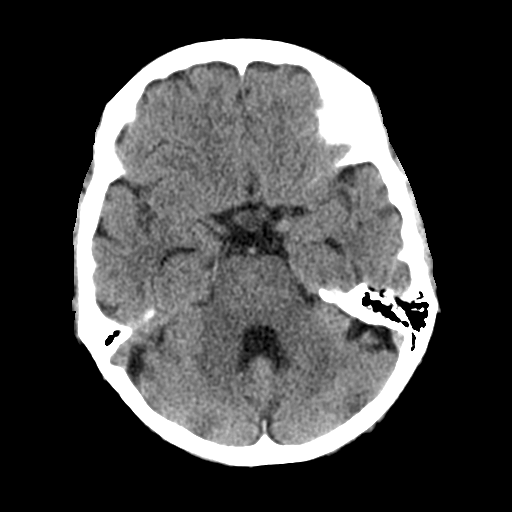
[im 11/30  brain]
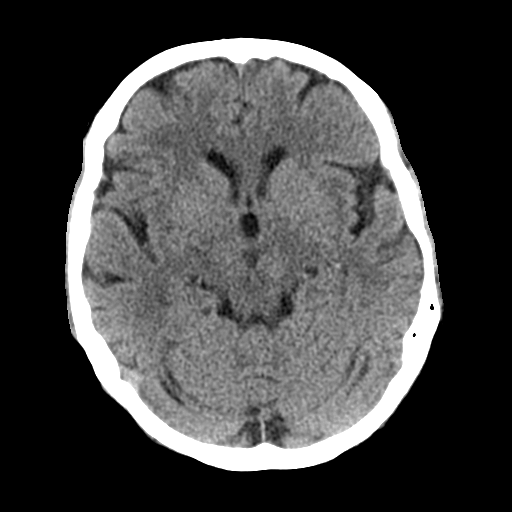
[im 14/30  brain]
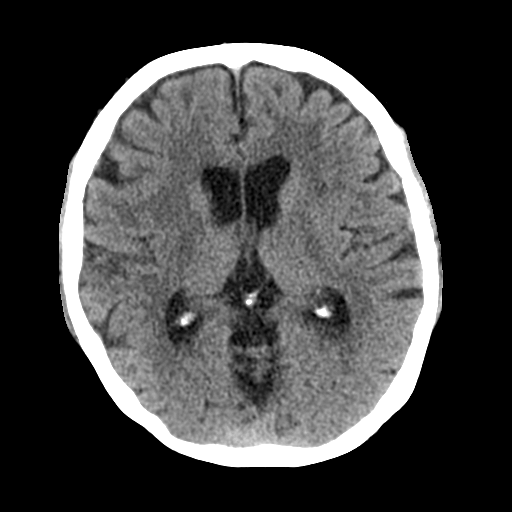
[im 14/30  bone]
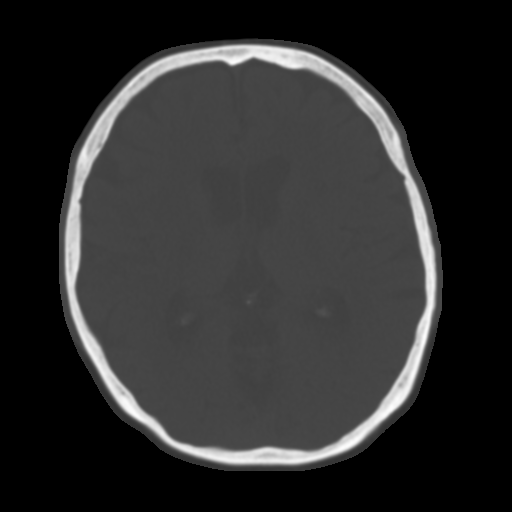
[im 17/30  brain]
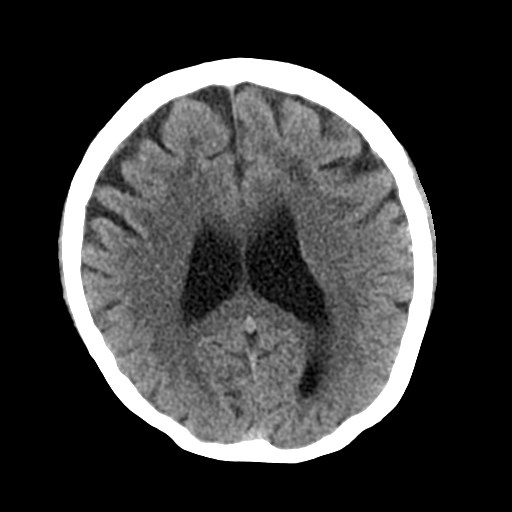
[im 20/30  brain]
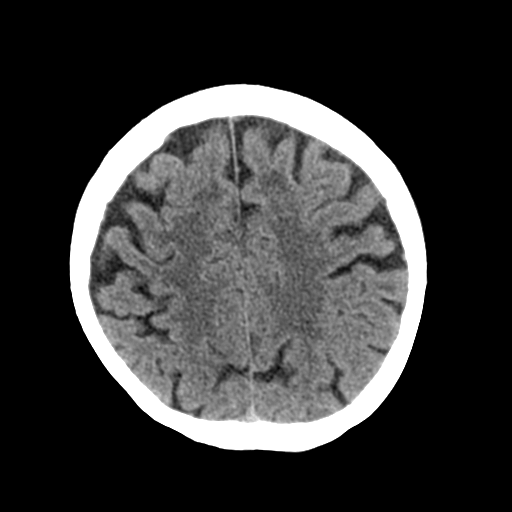
[im 23/30  brain]
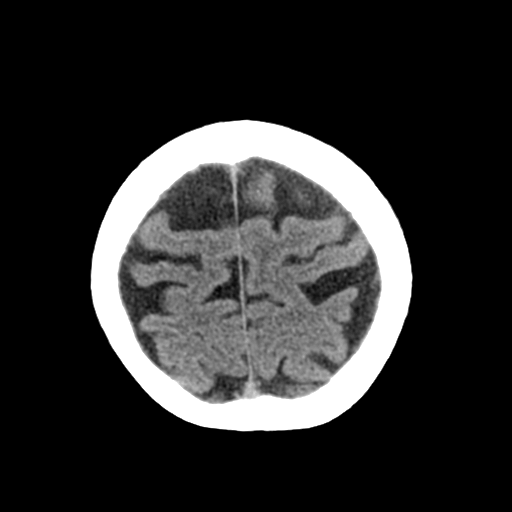
[im 25/30  brain]
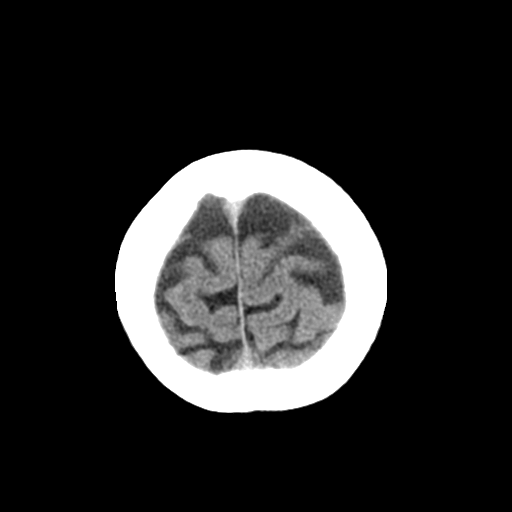
[im 25/30  bone]
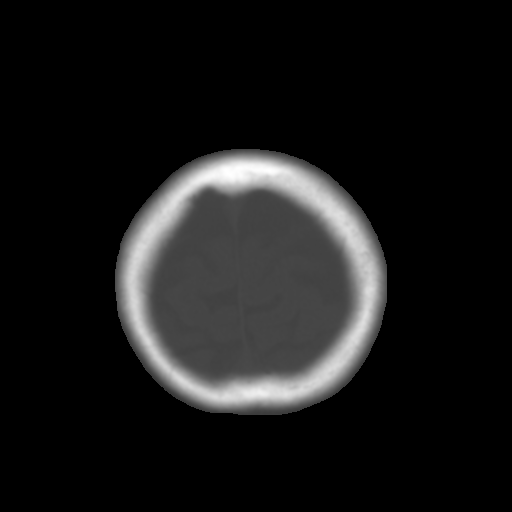
[im 28/30  brain]
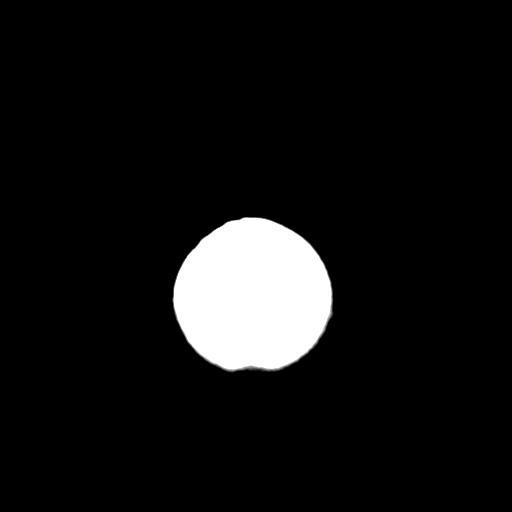

[Series 5: coronal soft tissue · coronal · 0.30mm/px · 3 of 70 slices shown]
[im 24/70  brain]
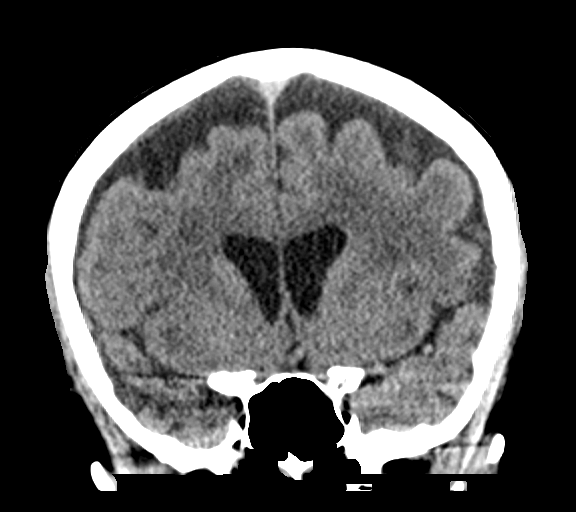
[im 31/70  brain]
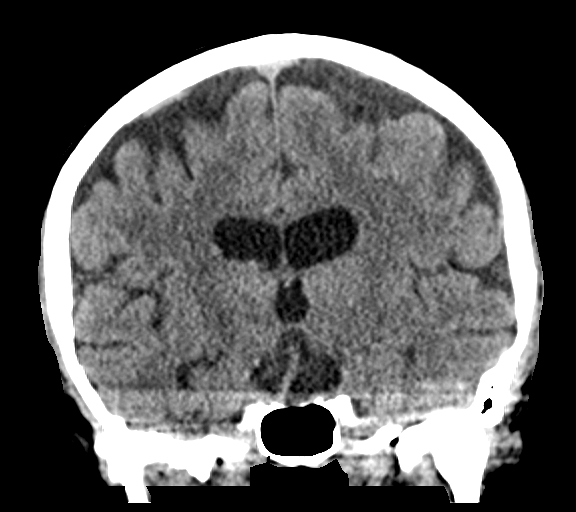
[im 39/70  brain]
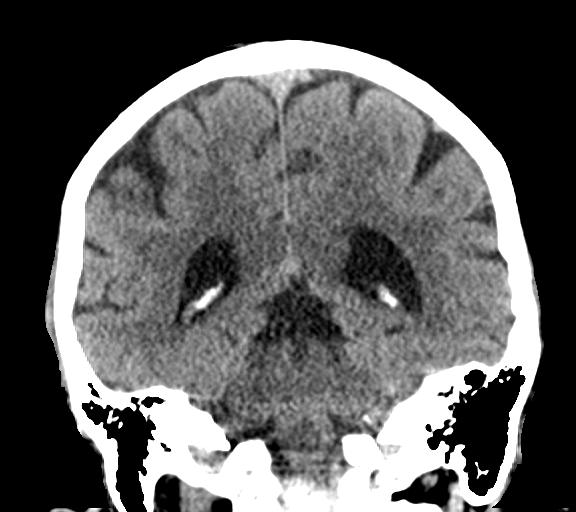

[Series 6: sagittal soft tissue · sagittal · 0.32mm/px · 3 of 56 slices shown]
[im 19/56  brain]
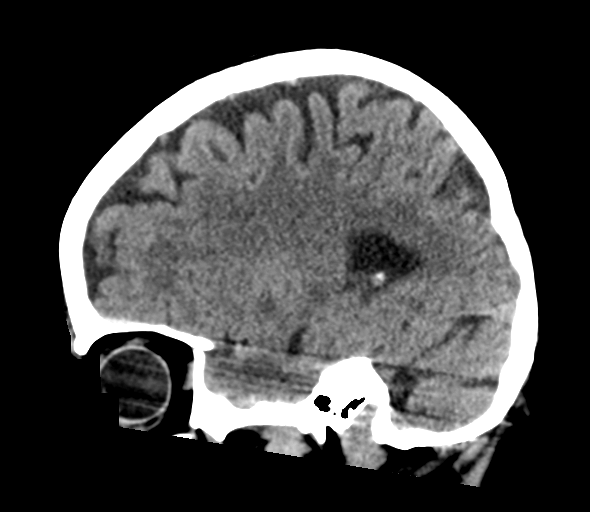
[im 28/56  brain]
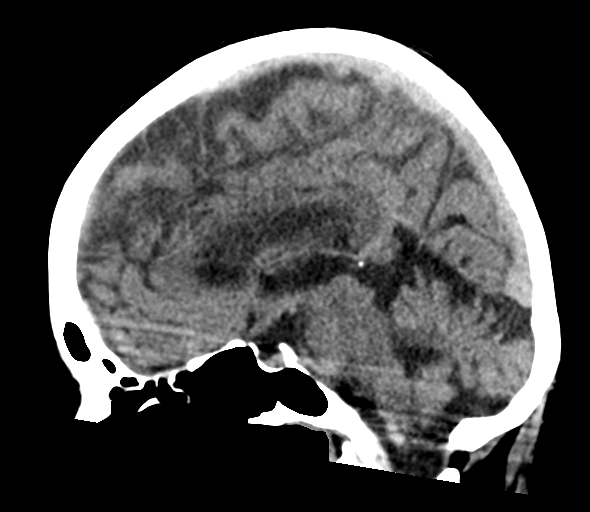
[im 37/56  brain]
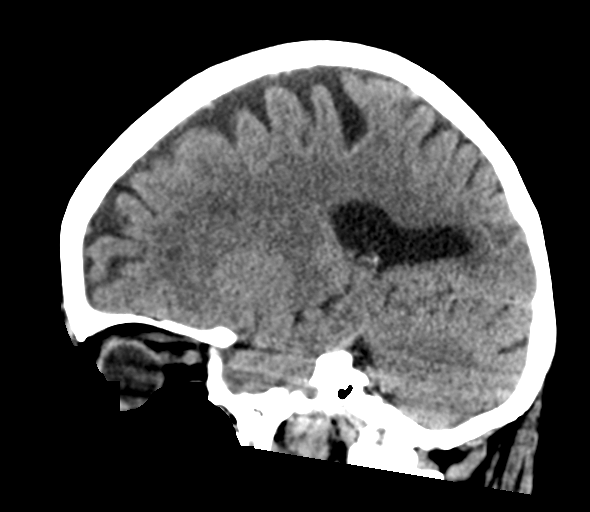

[16 of 47 positions shown; findings below may reference images not displayed]

FINDINGS: Brain: Mild age related volume loss. Mild chronic small vessel
disease throughout the deep white matter. No acute intracranial
abnormality. Specifically, no hemorrhage, hydrocephalus, mass
lesion, acute infarction, or significant intracranial injury.

Vascular: No hyperdense vessel or unexpected calcification.

Skull: No acute calvarial abnormality.

Sinuses/Orbits: Visualized paranasal sinuses and mastoids clear.
Orbital soft tissues unremarkable.

Other: None
IMPRESSION: No acute intracranial abnormality.

Atrophy, chronic microvascular disease.

## 2019-06-01 ENCOUNTER — Ambulatory Visit (INDEPENDENT_AMBULATORY_CARE_PROVIDER_SITE_OTHER): Payer: Medicare Other | Admitting: *Deleted

## 2019-06-01 DIAGNOSIS — G459 Transient cerebral ischemic attack, unspecified: Secondary | ICD-10-CM | POA: Diagnosis not present

## 2019-06-01 LAB — CUP PACEART REMOTE DEVICE CHECK
Date Time Interrogation Session: 20200915183824
Implantable Pulse Generator Implant Date: 20191226

## 2019-06-08 NOTE — Progress Notes (Signed)
Carelink Summary Report / Loop Recorder 

## 2019-07-05 ENCOUNTER — Ambulatory Visit (INDEPENDENT_AMBULATORY_CARE_PROVIDER_SITE_OTHER): Payer: Medicare Other | Admitting: *Deleted

## 2019-07-05 DIAGNOSIS — G459 Transient cerebral ischemic attack, unspecified: Secondary | ICD-10-CM

## 2019-07-05 LAB — CUP PACEART REMOTE DEVICE CHECK
Date Time Interrogation Session: 20201018183713
Implantable Pulse Generator Implant Date: 20191226

## 2019-07-23 NOTE — Progress Notes (Signed)
Carelink Summary Report / Loop Recorder 

## 2019-08-06 ENCOUNTER — Ambulatory Visit (INDEPENDENT_AMBULATORY_CARE_PROVIDER_SITE_OTHER): Payer: Medicare Other | Admitting: *Deleted

## 2019-08-06 DIAGNOSIS — I639 Cerebral infarction, unspecified: Secondary | ICD-10-CM | POA: Diagnosis not present

## 2019-08-06 LAB — CUP PACEART REMOTE DEVICE CHECK
Date Time Interrogation Session: 20201120133853
Implantable Pulse Generator Implant Date: 20191226

## 2019-08-16 ENCOUNTER — Telehealth: Payer: Self-pay | Admitting: Neurology

## 2019-08-16 DIAGNOSIS — R4701 Aphasia: Secondary | ICD-10-CM

## 2019-08-16 DIAGNOSIS — G459 Transient cerebral ischemic attack, unspecified: Secondary | ICD-10-CM

## 2019-08-16 MED ORDER — CLOPIDOGREL BISULFATE 75 MG PO TABS: 75.0000 mg | ORAL_TABLET | Freq: Every day | ORAL | 11 refills | Status: AC

## 2019-08-16 NOTE — Telephone Encounter (Signed)
1) Medication(s) Requested (by name): clopidogrel (PLAVIX) 75 MG tablet   2) Pharmacy of Choice:  [facility] [place] [address]

## 2019-08-16 NOTE — Telephone Encounter (Signed)
Spoke with Dr. Jaynee Eagles. Ok to refill for a year. Next appt scheduled 05/2020.

## 2019-08-26 ENCOUNTER — Other Ambulatory Visit: Payer: Self-pay

## 2019-08-26 MED ORDER — ROSUVASTATIN CALCIUM 10 MG PO TABS: 10.0000 mg | ORAL_TABLET | Freq: Every day | ORAL | 1 refills | Status: DC

## 2019-08-31 NOTE — Progress Notes (Signed)
Carelink Summary Report / Loop Recorder 

## 2019-09-08 ENCOUNTER — Ambulatory Visit (INDEPENDENT_AMBULATORY_CARE_PROVIDER_SITE_OTHER): Payer: Medicare Other | Admitting: *Deleted

## 2019-09-08 DIAGNOSIS — G459 Transient cerebral ischemic attack, unspecified: Secondary | ICD-10-CM

## 2019-09-08 LAB — CUP PACEART REMOTE DEVICE CHECK
Date Time Interrogation Session: 20201223134131
Implantable Pulse Generator Implant Date: 20191226

## 2019-10-11 ENCOUNTER — Ambulatory Visit (INDEPENDENT_AMBULATORY_CARE_PROVIDER_SITE_OTHER): Payer: Medicare PPO | Admitting: *Deleted

## 2019-10-11 DIAGNOSIS — G459 Transient cerebral ischemic attack, unspecified: Secondary | ICD-10-CM | POA: Diagnosis not present

## 2019-10-11 LAB — CUP PACEART REMOTE DEVICE CHECK
Date Time Interrogation Session: 20210125134714
Implantable Pulse Generator Implant Date: 20191226

## 2019-11-11 ENCOUNTER — Ambulatory Visit (INDEPENDENT_AMBULATORY_CARE_PROVIDER_SITE_OTHER): Payer: Medicare PPO | Admitting: *Deleted

## 2019-11-11 DIAGNOSIS — G459 Transient cerebral ischemic attack, unspecified: Secondary | ICD-10-CM | POA: Diagnosis not present

## 2019-11-11 LAB — CUP PACEART REMOTE DEVICE CHECK
Date Time Interrogation Session: 20210225135109
Implantable Pulse Generator Implant Date: 20191226

## 2019-11-12 NOTE — Progress Notes (Signed)
ILR Remote 

## 2019-12-13 ENCOUNTER — Ambulatory Visit (INDEPENDENT_AMBULATORY_CARE_PROVIDER_SITE_OTHER): Payer: Medicare PPO | Admitting: *Deleted

## 2019-12-13 DIAGNOSIS — G459 Transient cerebral ischemic attack, unspecified: Secondary | ICD-10-CM

## 2019-12-13 LAB — CUP PACEART REMOTE DEVICE CHECK
Date Time Interrogation Session: 20210328151544
Implantable Pulse Generator Implant Date: 20191226

## 2019-12-13 NOTE — Progress Notes (Signed)
ILR Remote 

## 2020-01-13 LAB — CUP PACEART REMOTE DEVICE CHECK
Date Time Interrogation Session: 20210429012140
Implantable Pulse Generator Implant Date: 20191226

## 2020-01-17 ENCOUNTER — Ambulatory Visit (INDEPENDENT_AMBULATORY_CARE_PROVIDER_SITE_OTHER): Payer: Medicare PPO | Admitting: *Deleted

## 2020-01-17 DIAGNOSIS — G459 Transient cerebral ischemic attack, unspecified: Secondary | ICD-10-CM

## 2020-01-17 NOTE — Progress Notes (Signed)
Carelink Summary Report / Loop Recorder 

## 2020-02-14 DIAGNOSIS — G459 Transient cerebral ischemic attack, unspecified: Secondary | ICD-10-CM | POA: Diagnosis not present

## 2020-02-14 LAB — CUP PACEART REMOTE DEVICE CHECK
Date Time Interrogation Session: 20210530012923
Implantable Pulse Generator Implant Date: 20191226

## 2020-02-15 ENCOUNTER — Ambulatory Visit (INDEPENDENT_AMBULATORY_CARE_PROVIDER_SITE_OTHER): Payer: Medicare PPO | Admitting: *Deleted

## 2020-02-15 DIAGNOSIS — G459 Transient cerebral ischemic attack, unspecified: Secondary | ICD-10-CM

## 2020-02-16 NOTE — Progress Notes (Signed)
Carelink Summary Report / Loop Recorder 

## 2020-02-20 ENCOUNTER — Other Ambulatory Visit: Payer: Self-pay | Admitting: Neurology

## 2020-03-17 ENCOUNTER — Ambulatory Visit (INDEPENDENT_AMBULATORY_CARE_PROVIDER_SITE_OTHER): Payer: Medicare PPO | Admitting: *Deleted

## 2020-03-17 DIAGNOSIS — G459 Transient cerebral ischemic attack, unspecified: Secondary | ICD-10-CM

## 2020-03-17 LAB — CUP PACEART REMOTE DEVICE CHECK
Date Time Interrogation Session: 20210701230344
Implantable Pulse Generator Implant Date: 20191226

## 2020-03-21 NOTE — Progress Notes (Signed)
Carelink Summary Report / Loop Recorder 

## 2020-04-20 LAB — CUP PACEART REMOTE DEVICE CHECK
Date Time Interrogation Session: 20210803231600
Implantable Pulse Generator Implant Date: 20191226

## 2020-04-24 ENCOUNTER — Ambulatory Visit (INDEPENDENT_AMBULATORY_CARE_PROVIDER_SITE_OTHER): Payer: Medicare PPO | Admitting: *Deleted

## 2020-04-24 DIAGNOSIS — G459 Transient cerebral ischemic attack, unspecified: Secondary | ICD-10-CM | POA: Diagnosis not present

## 2020-04-24 NOTE — Progress Notes (Signed)
Carelink Summary Report / Loop Recorder 

## 2020-05-18 ENCOUNTER — Ambulatory Visit: Payer: Self-pay | Admitting: Neurology

## 2020-05-24 ENCOUNTER — Other Ambulatory Visit: Payer: Self-pay | Admitting: Neurology

## 2020-05-25 ENCOUNTER — Other Ambulatory Visit: Payer: Self-pay | Admitting: Neurology

## 2020-05-27 LAB — CUP PACEART REMOTE DEVICE CHECK
Date Time Interrogation Session: 20210905233517
Implantable Pulse Generator Implant Date: 20191226

## 2020-05-29 ENCOUNTER — Ambulatory Visit (INDEPENDENT_AMBULATORY_CARE_PROVIDER_SITE_OTHER): Payer: Medicare PPO | Admitting: *Deleted

## 2020-05-29 DIAGNOSIS — G459 Transient cerebral ischemic attack, unspecified: Secondary | ICD-10-CM

## 2020-05-31 NOTE — Progress Notes (Signed)
Carelink Summary Report / Loop Recorder 

## 2020-06-01 ENCOUNTER — Ambulatory Visit (INDEPENDENT_AMBULATORY_CARE_PROVIDER_SITE_OTHER): Payer: Medicare PPO | Admitting: Neurology

## 2020-06-01 ENCOUNTER — Encounter: Payer: Self-pay | Admitting: Neurology

## 2020-06-01 ENCOUNTER — Other Ambulatory Visit: Payer: Self-pay

## 2020-06-01 VITALS — BP 168/85 | HR 95 | Ht 60.0 in | Wt 134.0 lb

## 2020-06-01 DIAGNOSIS — R4701 Aphasia: Secondary | ICD-10-CM | POA: Diagnosis not present

## 2020-06-01 DIAGNOSIS — G459 Transient cerebral ischemic attack, unspecified: Secondary | ICD-10-CM

## 2020-06-01 NOTE — Patient Instructions (Addendum)
Carpal Tunnel Syndrome  Cubital tunnel syndrome is a condition that causes pain and weakness of the forearm and hand. It happens when one of the nerves that runs along the inside of the elbow joint (ulnar nerve) becomes irritated. This condition is usually caused by repeated arm motions that are done during sports or work-related activities. What are the causes? This condition may be caused by:  Increased pressure on the ulnar nerve at the elbow, arm, or forearm. This can result from: ? Irritation caused by repeated elbow bending. ? Poorly healed elbow fractures. ? Tumors in the elbow. These are usually noncancerous (benign). ? Scar tissue that develops in the elbow after an injury. ? Bony growths (spurs) near the ulnar nerve.  Stretching of the nerve due to loose elbow ligaments.  Trauma to the nerve at the elbow. What increases the risk? The following factors may make you more likely to develop this condition:  Doing manual labor that requires frequent bending of the elbow.  Playing sports that include repeated or strenuous throwing motions, such as baseball.  Playing contact sports, such as football or lacrosse.  Not warming up properly before activities.  Having diabetes.  Having an underactive thyroid (hypothyroidism). What are the signs or symptoms? Symptoms of this condition include:  Clumsiness or weakness of the hand.  Tenderness of the inner elbow.  Aching or soreness of the inner elbow, forearm, or fingers, especially the little finger or the ring finger.  Increased pain when forcing the elbow to bend.  Reduced control when throwing objects.  Tingling, numbness, or a burning feeling inside the forearm or in part of the hand or fingers, especially the little finger or the ring finger.  Sharp pains that shoot from the elbow down to the wrist and hand.  The inability to grip or pinch hard. How is this diagnosed? This condition is diagnosed based  on:  Your symptoms and medical history. Your health care provider will also ask for details about any injury.  A physical exam. You may also have tests, including:  Electromyogram (EMG). This test measures electrical signals sent by your nerves into the muscles.  Nerve conduction study. This test measures how well electrical signals pass through your nerves.  Imaging tests, such as X-rays, ultrasound, and MRI. These tests check for possible causes of your condition. How is this treated? This condition may be treated by:  Stopping the activities that are causing your symptoms to get worse.  Icing and taking medicines to reduce pain and swelling.  Wearing a splint to prevent your elbow from bending, or wearing an elbow pad where the ulnar nerve is closest to the skin.  Working with a physical therapist in less severe cases. This may help to: ? Decrease your symptoms. ? Improve the strength and range of motion of your elbow, forearm, and hand. If these treatments do not help, surgery may be needed. Follow these instructions at home: If you have a splint:  Wear the splint as told by your health care provider. Remove it only as told by your health care provider.  Loosen the splint if your fingers tingle, become numb, or turn cold and blue.  Keep the splint clean.  If the splint is not waterproof: ? Do not let it get wet. ? Cover it with a watertight covering when you take a bath or shower. Managing pain, stiffness, and swelling   If directed, put ice on the injured area: ? Put ice in a  plastic bag. ? Place a towel between your skin and the bag. ? Leave the ice on for 20 minutes, 2-3 times a day.  Move your fingers often to avoid stiffness and to lessen swelling.  Raise (elevate) the injured area above the level of your heart while you are sitting or lying down. General instructions  Take over-the-counter and prescription medicines only as told by your health care  provider.  Do any exercise or physical therapy as told by your health care provider.  Do not drive or use heavy machinery while taking prescription pain medicine.  If you were given an elbow pad, wear it as told by your health care provider.  Keep all follow-up visits as told by your health care provider. This is important. Contact a health care provider if:  Your symptoms get worse.  Your symptoms do not get better with treatment.  You have new pain.  Your hand on the injured side feels numb or cold. Summary  Cubital tunnel syndrome is a condition that causes pain and weakness of the forearm and hand.  You are more likely to develop this condition if you do work or play sports that involve repeated arm movements.  This condition is often treated by stopping repetitive activities, applying ice, and using anti-inflammatory medicines.  In rare cases, surgery may be needed. This information is not intended to replace advice given to you by your health care provider. Make sure you discuss any questions you have with your health care provider. Document Revised: 01/19/2018 Document Reviewed: 01/19/2018 Elsevier Patient Education  2020 [employer].

## 2020-06-01 NOTE — Progress Notes (Signed)
[facility]    Provider:  Dr Lucia Gaskins Referring Provider: Alysia Penna, MD Primary Care Physician:  Alysia Penna, MD  CC:  Episodes of Aphasia  Everything has been fine for the year. We discussed the booster. No more episodes. Never found anything on loop recorder. No dizziness. No falls. No migraines or episodes of aphasia. We spoke about balance and yoga. We discussed yoga classes, I gave her a reference for a "slow stretch" yoga class and encouraged balance.   Interval update May 17, 2019: Patient is here for follow-up of TIA, she has had loop implanted in the past, routine EEG in the past normal as well as ambulatory 3-day EEG.  Here for follow-up. No more episodes. Nothing found on loop recorder to date. No more episodes.No dizziness. No falls. No recent migraines. No recent episodes of aphasia. She is doing yoga, gentle yoga.   Interval update 09/29/2018: She had an episode of dizziness last week. But none since. Discussed could be seizures. She had the loop implanted. No episodes since last being seen. Discussed seizures and causes of seizures, types of seizures. Routine eeg in the past normal but those are brief. Gave options, Discussed and decided on 3-day ambulatory eeg.    Interval update 08/18/2018: Patient here for follup on TIA.  Patient was in [place] visiting her niece. She was talking to the kids, and she said "mirror" instead of mixer and couldn't get anything out, she couldn't figure out the oven. Lasted 5-10 minutes. Remembered the whole incident. Just like th first time it happened. Does not miss her aspirin. We had a heart monitor for 30 days, eeg was negative.   HPI:  Olivia Estes is a 79 y.o. female here as a referral from Dr. Link Snuffer for aphasia.  Past medical history of hyperlipidemia, hypothyroidism. On February first she called a friend to wish her a happy birthday, she was slurred, she couldn't talk, the word didn't come out right, she had  utterances but they were not words "garbled".  It lasted a few minutes. It was acute and then abruptly stopped. No alteration of awareness or seizure-like activity. She went to [facility] and had a stroke evaluation. No residual deficits. No tpa was administered. No symptoms of sleep apnea. No falls. Daughter is here and provides much information as well. Symptoms resolved. No other focal neurologic deficits, associated symptoms, inciting events or modifiable factors.  Reviewed notes, labs and imaging from outside physicians, which showed:  Reviewed referring physician notes.  Patient presented with a brief episode of difficulty speaking which lasted about 10 minutes.  A TIA workup was negative.  Of note were recommended starting aspirin.  She had an echocardiogram with left ventricular ejection fraction 65-70%.  She is been quite anxious about this.  MRI of the brain was negative for stroke, stroke workup was negative, she was started on aspirin. Echocardiogram did not show thrombus or pfo. She also already had a 30-day monitor.   Reviewed MRI brain images and MRA head and agree with the following(reviewed with patient):  MRI HEAD:  1. No acute intracranial process. 2. Moderate chronic small vessel ischemic disease.  MRA HEAD:  1. No emergent large vessel occlusion or flow limiting stenosis.  Review of Systems: Patient complains of symptoms per HPI as well as the following symptoms: anxiety. Pertinent negatives and positives per HPI. All others negative.   Social History   Socioeconomic History  . Marital status: Widowed    Spouse name: Not  on file  . Number of children: 2  . Years of education: Not on file  . Highest education level: Bachelor's degree (e.g., BA, AB, BS)  Occupational History  . Occupation: retired Runner, broadcasting/film/video  Tobacco Use  . Smoking status: Former Smoker    Years: 20.00    Quit date: 1995    Years since quitting: 26.7  . Smokeless tobacco: Never Used  Vaping  Use  . Vaping Use: Never used  Substance and Sexual Activity  . Alcohol use: No  . Drug use: No  . Sexual activity: Not on file  Other Topics Concern  . Not on file  Social History Narrative   Lives at home with her daughter   Right handed   Drinks 1 cup of coffee in the morning   Social Determinants of Health   Financial Resource Strain:   . Difficulty of Paying Living Expenses: Not on file  Food Insecurity:   . Worried About Programme researcher, broadcasting/film/video in the Last Year: Not on file  . Ran Out of Food in the Last Year: Not on file  Transportation Needs:   . Lack of Transportation (Medical): Not on file  . Lack of Transportation (Non-Medical): Not on file  Physical Activity:   . Days of Exercise per Week: Not on file  . Minutes of Exercise per Session: Not on file  Stress:   . Feeling of Stress : Not on file  Social Connections:   . Frequency of Communication with Friends and Family: Not on file  . Frequency of Social Gatherings with Friends and Family: Not on file  . Attends Religious Services: Not on file  . Active Member of Clubs or Organizations: Not on file  . Attends Banker Meetings: Not on file  . Marital Status: Not on file  Intimate Partner Violence:   . Fear of Current or Ex-Partner: Not on file  . Emotionally Abused: Not on file  . Physically Abused: Not on file  . Sexually Abused: Not on file    Family History  Problem Relation Age of Onset  . Diabetes Mellitus II Mother   . Heart attack Father     Past Medical History:  Diagnosis Date  . Hypercholesterolemia   . Mild anxiety   . Thyroid disease   . TIA (transient ischemic attack)     Past Surgical History:  Procedure Laterality Date  . CHOLECYSTECTOMY    . LOOP RECORDER INSERTION N/A 09/10/2018   Procedure: LOOP RECORDER INSERTION;  Surgeon: Hillis Range, MD;  Location: [facility];  Service: Cardiovascular;  Laterality: N/A;    Current Outpatient Medications  Medication Sig  Dispense Refill  . ALPRAZolam (XANAX) 0.25 MG tablet Take 0.25 mg by mouth at bedtime as needed for anxiety.     . clopidogrel (PLAVIX) 75 MG tablet Take 1 tablet (75 mg total) by mouth daily. 30 tablet 11  . CRANBERRY PO Take 1 tablet by mouth daily. 500 mg    . cyclobenzaprine (FLEXERIL) 10 MG tablet Take 10 mg by mouth at bedtime.     Marland Kitchen ESTRACE VAGINAL 0.1 MG/GM vaginal cream Place 1 g vaginally 2 (two) times a week. Thursday & Sunday  11  . Fluticasone Propionate (FLONASE NA) Place 2 sprays into the nose daily.     Marland Kitchen HYDROcodone-Acetaminophen (VICODIN ES) 7.5-300 MG TABS Take 1 tablet by mouth daily as needed (back pain).     . hydrOXYzine (ATARAX/VISTARIL) 25 MG tablet Take 25 mg by  mouth at bedtime.   0  . levothyroxine (SYNTHROID, LEVOTHROID) 75 MCG tablet Take 75 mcg by mouth daily before breakfast.     . rosuvastatin (CRESTOR) 10 MG tablet TAKE 1 TABLET(10 MG) BY MOUTH DAILY 30 tablet 0  . saccharomyces boulardii (FLORASTOR) 250 MG capsule Take 250 mg by mouth as needed (stomach symptoms).     Marland Kitchen VITAMIN D PO Take 100 mcg by mouth daily.      No current facility-administered medications for this visit.    Allergies as of 06/01/2020 - Review Complete 05/17/2019  Allergen Reaction Noted  . Ceftin [cefuroxime axetil] Rash 09/16/2016    Vitals: BP (!) 168/85   Pulse 95   Ht 5' (1.524 m)   Wt 134 lb (60.8 kg)   BMI 26.17 kg/m  Last Weight:  Wt Readings from Last 1 Encounters:  06/01/20 134 lb (60.8 kg)   Last Height:   Ht Readings from Last 1 Encounters:  06/01/20 5' (1.524 m)    Physical exam: Exam: Gen: NAD, conversant, well nourised, well groomed                     CV: RRR, no MRG. No Carotid Bruits. No peripheral edema, warm, nontender Eyes: Conjunctivae clear without exudates or hemorrhage  Neuro: Detailed Neurologic Exam  Speech:    Speech is normal; fluent and spontaneous with normal comprehension.  Cognition:    The patient is oriented to person, place,  and time;     recent and remote memory intact;     language fluent;     normal attention, concentration,     fund of knowledge Cranial Nerves:    The pupils are equal, round, and reactive to light. The fundi are normal and spontaneous venous pulsations are present. Visual fields are full to finger confrontation. Extraocular movements are intact. Trigeminal sensation is intact and the muscles of mastication are normal. The face is symmetric. The palate elevates in the midline. Hearing intact. Voice is normal. Shoulder shrug is normal. The tongue has normal motion without fasciculations.   Coordination:    Normal finger to nose and heel to shin. Normal rapid alternating movements.   Gait:    Heel-toe and tandem gait are normal.   Motor Observation:    No asymmetry, no atrophy, and no involuntary movements noted. Tone:    Normal muscle tone.    Posture:    Posture is normal. normal erect    Strength:    Strength is V/V in the upper and lower limbs.      Sensation: intact to LT     Reflex Exam:  DTR's:    Deep tendon reflexes in the upper and lower extremities are symmetrical bilaterally.   Toes:    The toes are equivocal bilaterally.   Clonus:    Clonus is absent.      Assessment/Plan:  48 79 year old female with repeat TIAs of aphasia. Stroke w/up in past negative. eeg and 30-day heart monitor were unremarkable. Doing well, no changes, stable  - change from Aspirin to plavix: completed continue - repeat MRI brain: unremarkabe - check lipid and hgba1c: ldl 111, hgba1c 6.5 asked her to f/u with pcp for goal ldl < 70 and for treatment of diabetes - Referral to Dr. Johney Frame for eval of loop recorder: was completed, nothing found to date - 3-day ambulatory eeg ordered: no seizures or epileptiform activity.  - no more episodes, doing well, follow up as needed  I had a long d/w patient about her recent TIA, risk for recurrent stroke/TIAs, personally independently reviewed  imaging studies and stroke evaluation results and answered questions.Plavix for stroke prevention and maintain strict control of blood pressure goal below 130/90, hemoglobin A1c goal below 6.5% and lipids with LDL cholesterol goal below 70 mg/dL. I also advised the patient to eat a healthy diet with plenty of whole grains, cereals, fruits and vegetables, exercise regularly and maintain ideal body weight .  I spent 20 minutes of face-to-face and non-face-to-face time with patient on the  1. Aphasia   2. TIA (transient ischemic attack)    diagnosis.  This included previsit chart review, lab review, study review, order entry, electronic health record documentation, patient education on the different diagnostic and therapeutic options, counseling and coordination of care, risks and benefits of management, compliance, or risk factor reduction   Olivia DeanAntonia Benjaman Artman, MD  [facility] [address] [place], [place] [id number]  Phone [phone] Fax [phone]

## 2020-06-23 ENCOUNTER — Other Ambulatory Visit: Payer: Self-pay | Admitting: Neurology

## 2020-06-24 LAB — CUP PACEART REMOTE DEVICE CHECK
Date Time Interrogation Session: 20211008233803
Implantable Pulse Generator Implant Date: 20191226

## 2020-07-03 ENCOUNTER — Ambulatory Visit (INDEPENDENT_AMBULATORY_CARE_PROVIDER_SITE_OTHER): Payer: Medicare PPO

## 2020-07-03 DIAGNOSIS — I639 Cerebral infarction, unspecified: Secondary | ICD-10-CM

## 2020-07-06 NOTE — Progress Notes (Signed)
Carelink Summary Report / Loop Recorder 

## 2020-08-07 ENCOUNTER — Ambulatory Visit (INDEPENDENT_AMBULATORY_CARE_PROVIDER_SITE_OTHER): Payer: Medicare PPO

## 2020-08-07 DIAGNOSIS — G459 Transient cerebral ischemic attack, unspecified: Secondary | ICD-10-CM

## 2020-08-07 LAB — CUP PACEART REMOTE DEVICE CHECK
Date Time Interrogation Session: 20211122001323
Implantable Pulse Generator Implant Date: 20191226

## 2020-08-08 NOTE — Progress Notes (Signed)
Carelink Summary Report / Loop Recorder 

## 2020-09-10 LAB — CUP PACEART REMOTE DEVICE CHECK
Date Time Interrogation Session: 20211225001330
Implantable Pulse Generator Implant Date: 20191226

## 2020-09-11 ENCOUNTER — Ambulatory Visit (INDEPENDENT_AMBULATORY_CARE_PROVIDER_SITE_OTHER): Payer: Medicare PPO

## 2020-09-11 DIAGNOSIS — I639 Cerebral infarction, unspecified: Secondary | ICD-10-CM | POA: Diagnosis not present

## 2020-09-25 NOTE — Progress Notes (Signed)
Carelink Summary Report / Loop Recorder 

## 2020-09-27 ENCOUNTER — Other Ambulatory Visit: Payer: Self-pay | Admitting: Neurology

## 2020-10-14 LAB — CUP PACEART REMOTE DEVICE CHECK
Date Time Interrogation Session: 20220127002819
Implantable Pulse Generator Implant Date: 20191226

## 2020-10-16 ENCOUNTER — Ambulatory Visit (INDEPENDENT_AMBULATORY_CARE_PROVIDER_SITE_OTHER): Payer: Medicare PPO

## 2020-10-16 DIAGNOSIS — G459 Transient cerebral ischemic attack, unspecified: Secondary | ICD-10-CM | POA: Diagnosis not present

## 2020-10-24 NOTE — Progress Notes (Signed)
Carelink Summary Report / Loop Recorder 

## 2020-11-20 ENCOUNTER — Ambulatory Visit (INDEPENDENT_AMBULATORY_CARE_PROVIDER_SITE_OTHER): Payer: Medicare PPO

## 2020-11-20 DIAGNOSIS — G459 Transient cerebral ischemic attack, unspecified: Secondary | ICD-10-CM | POA: Diagnosis not present

## 2020-11-22 LAB — CUP PACEART REMOTE DEVICE CHECK
Date Time Interrogation Session: 20220301003257
Implantable Pulse Generator Implant Date: 20191226

## 2020-11-28 NOTE — Progress Notes (Signed)
Carelink Summary Report / Loop Recorder 

## 2020-12-23 LAB — CUP PACEART REMOTE DEVICE CHECK
Date Time Interrogation Session: 20220403013652
Implantable Pulse Generator Implant Date: 20191226

## 2020-12-25 ENCOUNTER — Ambulatory Visit (INDEPENDENT_AMBULATORY_CARE_PROVIDER_SITE_OTHER): Payer: Medicare PPO

## 2020-12-25 DIAGNOSIS — G459 Transient cerebral ischemic attack, unspecified: Secondary | ICD-10-CM | POA: Diagnosis not present

## 2020-12-28 ENCOUNTER — Other Ambulatory Visit: Payer: Self-pay | Admitting: Neurology

## 2020-12-29 ENCOUNTER — Telehealth: Payer: Self-pay | Admitting: Neurology

## 2020-12-29 MED ORDER — ROSUVASTATIN CALCIUM 10 MG PO TABS: 10.0000 mg | ORAL_TABLET | Freq: Every day | ORAL | 2 refills | Status: AC

## 2020-12-29 NOTE — Telephone Encounter (Signed)
The patient called for a refill on the Crestor, I sent in the refill, in the long run it looks like Dr. Lucia Gaskins wishes to have the primary care doctor take over the prescription.

## 2021-01-05 NOTE — Progress Notes (Signed)
Carelink Summary Report / Loop Recorder 

## 2021-01-29 ENCOUNTER — Ambulatory Visit (INDEPENDENT_AMBULATORY_CARE_PROVIDER_SITE_OTHER): Payer: Medicare PPO

## 2021-01-29 DIAGNOSIS — G459 Transient cerebral ischemic attack, unspecified: Secondary | ICD-10-CM | POA: Diagnosis not present

## 2021-01-30 LAB — CUP PACEART REMOTE DEVICE CHECK
Date Time Interrogation Session: 20220514231227
Implantable Pulse Generator Implant Date: 20191226

## 2021-02-20 NOTE — Progress Notes (Signed)
Carelink Summary Report / Loop Recorder 

## 2021-03-05 ENCOUNTER — Ambulatory Visit (INDEPENDENT_AMBULATORY_CARE_PROVIDER_SITE_OTHER): Payer: Medicare PPO

## 2021-03-05 DIAGNOSIS — G459 Transient cerebral ischemic attack, unspecified: Secondary | ICD-10-CM | POA: Diagnosis not present

## 2021-03-06 LAB — CUP PACEART REMOTE DEVICE CHECK
Date Time Interrogation Session: 20220616232219
Implantable Pulse Generator Implant Date: 20191226

## 2021-03-23 NOTE — Progress Notes (Signed)
Carelink Summary Report / Loop Recorder 

## 2021-04-04 LAB — CUP PACEART REMOTE DEVICE CHECK
Date Time Interrogation Session: 20220719233312
Implantable Pulse Generator Implant Date: 20191226

## 2021-04-09 ENCOUNTER — Ambulatory Visit (INDEPENDENT_AMBULATORY_CARE_PROVIDER_SITE_OTHER): Payer: Medicare PPO

## 2021-04-09 DIAGNOSIS — I639 Cerebral infarction, unspecified: Secondary | ICD-10-CM

## 2021-05-02 NOTE — Progress Notes (Signed)
Carelink Summary Report / Loop Recorder 

## 2021-05-14 ENCOUNTER — Ambulatory Visit (INDEPENDENT_AMBULATORY_CARE_PROVIDER_SITE_OTHER): Payer: Medicare PPO

## 2021-05-14 DIAGNOSIS — I639 Cerebral infarction, unspecified: Secondary | ICD-10-CM

## 2021-05-14 LAB — CUP PACEART REMOTE DEVICE CHECK
Date Time Interrogation Session: 20220822000810
Implantable Pulse Generator Implant Date: 20191226

## 2021-05-25 NOTE — Progress Notes (Signed)
Carelink Summary Report / Loop Recorder 

## 2021-05-28 DIAGNOSIS — H1789 Other corneal scars and opacities: Secondary | ICD-10-CM | POA: Diagnosis not present

## 2021-05-28 DIAGNOSIS — H26491 Other secondary cataract, right eye: Secondary | ICD-10-CM | POA: Diagnosis not present

## 2021-05-28 DIAGNOSIS — H524 Presbyopia: Secondary | ICD-10-CM | POA: Diagnosis not present

## 2021-05-28 DIAGNOSIS — H11001 Unspecified pterygium of right eye: Secondary | ICD-10-CM | POA: Diagnosis not present

## 2021-06-01 DIAGNOSIS — E039 Hypothyroidism, unspecified: Secondary | ICD-10-CM | POA: Diagnosis not present

## 2021-06-01 DIAGNOSIS — E559 Vitamin D deficiency, unspecified: Secondary | ICD-10-CM | POA: Diagnosis not present

## 2021-06-01 DIAGNOSIS — E785 Hyperlipidemia, unspecified: Secondary | ICD-10-CM | POA: Diagnosis not present

## 2021-06-08 DIAGNOSIS — Z8673 Personal history of transient ischemic attack (TIA), and cerebral infarction without residual deficits: Secondary | ICD-10-CM | POA: Diagnosis not present

## 2021-06-08 DIAGNOSIS — Z1331 Encounter for screening for depression: Secondary | ICD-10-CM | POA: Diagnosis not present

## 2021-06-08 DIAGNOSIS — E785 Hyperlipidemia, unspecified: Secondary | ICD-10-CM | POA: Diagnosis not present

## 2021-06-08 DIAGNOSIS — E039 Hypothyroidism, unspecified: Secondary | ICD-10-CM | POA: Diagnosis not present

## 2021-06-08 DIAGNOSIS — E1169 Type 2 diabetes mellitus with other specified complication: Secondary | ICD-10-CM | POA: Diagnosis not present

## 2021-06-08 DIAGNOSIS — Z1389 Encounter for screening for other disorder: Secondary | ICD-10-CM | POA: Diagnosis not present

## 2021-06-08 DIAGNOSIS — Z23 Encounter for immunization: Secondary | ICD-10-CM | POA: Diagnosis not present

## 2021-06-08 DIAGNOSIS — E559 Vitamin D deficiency, unspecified: Secondary | ICD-10-CM | POA: Diagnosis not present

## 2021-06-08 DIAGNOSIS — Z0000 Encounter for general adult medical examination without abnormal findings: Secondary | ICD-10-CM | POA: Diagnosis not present

## 2021-06-18 ENCOUNTER — Ambulatory Visit (INDEPENDENT_AMBULATORY_CARE_PROVIDER_SITE_OTHER): Payer: Medicare PPO

## 2021-06-18 DIAGNOSIS — I639 Cerebral infarction, unspecified: Secondary | ICD-10-CM

## 2021-06-18 LAB — CUP PACEART REMOTE DEVICE CHECK
Date Time Interrogation Session: 20220924000715
Implantable Pulse Generator Implant Date: 20191226

## 2021-06-25 NOTE — Progress Notes (Signed)
Carelink Summary Report / Loop Recorder 

## 2021-07-12 LAB — CUP PACEART REMOTE DEVICE CHECK
Date Time Interrogation Session: 20221027001358
Implantable Pulse Generator Implant Date: 20191226

## 2021-07-13 DIAGNOSIS — N952 Postmenopausal atrophic vaginitis: Secondary | ICD-10-CM | POA: Diagnosis not present

## 2021-07-13 DIAGNOSIS — N94819 Vulvodynia, unspecified: Secondary | ICD-10-CM | POA: Diagnosis not present

## 2021-07-23 ENCOUNTER — Ambulatory Visit (INDEPENDENT_AMBULATORY_CARE_PROVIDER_SITE_OTHER): Payer: Medicare PPO

## 2021-07-23 DIAGNOSIS — I639 Cerebral infarction, unspecified: Secondary | ICD-10-CM | POA: Diagnosis not present

## 2021-07-26 NOTE — Progress Notes (Signed)
Carelink Summary Report / Loop Recorder 

## 2021-08-23 LAB — CUP PACEART REMOTE DEVICE CHECK
Date Time Interrogation Session: 20221208080740
Implantable Pulse Generator Implant Date: 20191226

## 2021-08-27 ENCOUNTER — Ambulatory Visit (INDEPENDENT_AMBULATORY_CARE_PROVIDER_SITE_OTHER): Payer: Medicare PPO

## 2021-08-27 DIAGNOSIS — I639 Cerebral infarction, unspecified: Secondary | ICD-10-CM | POA: Diagnosis not present

## 2021-09-04 NOTE — Progress Notes (Signed)
Carelink Summary Report / Loop Recorder 

## 2021-09-11 DIAGNOSIS — Z1231 Encounter for screening mammogram for malignant neoplasm of breast: Secondary | ICD-10-CM | POA: Diagnosis not present

## 2021-09-11 DIAGNOSIS — Z780 Asymptomatic menopausal state: Secondary | ICD-10-CM | POA: Diagnosis not present

## 2021-09-11 DIAGNOSIS — M85851 Other specified disorders of bone density and structure, right thigh: Secondary | ICD-10-CM | POA: Diagnosis not present

## 2021-09-11 DIAGNOSIS — M85852 Other specified disorders of bone density and structure, left thigh: Secondary | ICD-10-CM | POA: Diagnosis not present

## 2021-10-01 ENCOUNTER — Ambulatory Visit (INDEPENDENT_AMBULATORY_CARE_PROVIDER_SITE_OTHER): Payer: Medicare PPO

## 2021-10-01 DIAGNOSIS — I639 Cerebral infarction, unspecified: Secondary | ICD-10-CM

## 2021-10-01 LAB — CUP PACEART REMOTE DEVICE CHECK
Date Time Interrogation Session: 20230115232419
Implantable Pulse Generator Implant Date: 20191226

## 2021-10-10 NOTE — Progress Notes (Signed)
Carelink Summary Report / Loop Recorder 

## 2021-11-03 LAB — CUP PACEART REMOTE DEVICE CHECK
Date Time Interrogation Session: 20230217232417
Implantable Pulse Generator Implant Date: 20191226

## 2021-11-05 ENCOUNTER — Ambulatory Visit (INDEPENDENT_AMBULATORY_CARE_PROVIDER_SITE_OTHER): Payer: Medicare PPO

## 2021-11-05 DIAGNOSIS — I639 Cerebral infarction, unspecified: Secondary | ICD-10-CM | POA: Diagnosis not present

## 2021-11-09 NOTE — Progress Notes (Signed)
Carelink Summary Report / Loop Recorder 

## 2021-11-23 DIAGNOSIS — E1169 Type 2 diabetes mellitus with other specified complication: Secondary | ICD-10-CM | POA: Diagnosis not present

## 2021-11-23 DIAGNOSIS — E039 Hypothyroidism, unspecified: Secondary | ICD-10-CM | POA: Diagnosis not present

## 2021-11-23 DIAGNOSIS — F419 Anxiety disorder, unspecified: Secondary | ICD-10-CM | POA: Diagnosis not present

## 2021-11-23 DIAGNOSIS — E559 Vitamin D deficiency, unspecified: Secondary | ICD-10-CM | POA: Diagnosis not present

## 2021-11-23 DIAGNOSIS — E785 Hyperlipidemia, unspecified: Secondary | ICD-10-CM | POA: Diagnosis not present

## 2021-11-23 DIAGNOSIS — Z8673 Personal history of transient ischemic attack (TIA), and cerebral infarction without residual deficits: Secondary | ICD-10-CM | POA: Diagnosis not present

## 2021-12-12 ENCOUNTER — Ambulatory Visit (INDEPENDENT_AMBULATORY_CARE_PROVIDER_SITE_OTHER): Payer: Medicare PPO

## 2021-12-12 DIAGNOSIS — I639 Cerebral infarction, unspecified: Secondary | ICD-10-CM

## 2021-12-12 LAB — CUP PACEART REMOTE DEVICE CHECK
Date Time Interrogation Session: 20230328230658
Implantable Pulse Generator Implant Date: 20191226

## 2021-12-14 DIAGNOSIS — J018 Other acute sinusitis: Secondary | ICD-10-CM | POA: Diagnosis not present

## 2021-12-14 DIAGNOSIS — R059 Cough, unspecified: Secondary | ICD-10-CM | POA: Diagnosis not present

## 2021-12-14 DIAGNOSIS — J3489 Other specified disorders of nose and nasal sinuses: Secondary | ICD-10-CM | POA: Diagnosis not present

## 2021-12-14 DIAGNOSIS — G459 Transient cerebral ischemic attack, unspecified: Secondary | ICD-10-CM | POA: Diagnosis not present

## 2021-12-14 DIAGNOSIS — R5383 Other fatigue: Secondary | ICD-10-CM | POA: Diagnosis not present

## 2021-12-14 DIAGNOSIS — Z1152 Encounter for screening for COVID-19: Secondary | ICD-10-CM | POA: Diagnosis not present

## 2021-12-14 DIAGNOSIS — J029 Acute pharyngitis, unspecified: Secondary | ICD-10-CM | POA: Diagnosis not present

## 2021-12-25 NOTE — Progress Notes (Signed)
Carelink Summary Report / Loop Recorder 

## 2022-01-14 ENCOUNTER — Ambulatory Visit (INDEPENDENT_AMBULATORY_CARE_PROVIDER_SITE_OTHER): Payer: Medicare PPO

## 2022-01-14 DIAGNOSIS — I639 Cerebral infarction, unspecified: Secondary | ICD-10-CM | POA: Diagnosis not present

## 2022-01-14 LAB — CUP PACEART REMOTE DEVICE CHECK
Date Time Interrogation Session: 20230430232824
Implantable Pulse Generator Implant Date: 20191226

## 2022-01-29 NOTE — Progress Notes (Signed)
Carelink Summary Report / Loop Recorder 

## 2022-02-17 LAB — CUP PACEART REMOTE DEVICE CHECK
Date Time Interrogation Session: 20230602232343
Implantable Pulse Generator Implant Date: 20191226

## 2022-02-18 ENCOUNTER — Ambulatory Visit (INDEPENDENT_AMBULATORY_CARE_PROVIDER_SITE_OTHER): Payer: Medicare PPO

## 2022-02-18 DIAGNOSIS — I639 Cerebral infarction, unspecified: Secondary | ICD-10-CM

## 2022-03-06 NOTE — Progress Notes (Signed)
Carelink Summary Report / Loop Recorder 

## 2022-03-24 LAB — CUP PACEART REMOTE DEVICE CHECK
Date Time Interrogation Session: 20230705234640
Implantable Pulse Generator Implant Date: 20191226

## 2022-03-25 ENCOUNTER — Ambulatory Visit (INDEPENDENT_AMBULATORY_CARE_PROVIDER_SITE_OTHER): Payer: Medicare PPO

## 2022-03-25 DIAGNOSIS — I639 Cerebral infarction, unspecified: Secondary | ICD-10-CM

## 2022-04-24 LAB — CUP PACEART REMOTE DEVICE CHECK
Date Time Interrogation Session: 20230807234852
Implantable Pulse Generator Implant Date: 20191226

## 2022-04-24 NOTE — Progress Notes (Signed)
Carelink Summary Report / Loop Recorder 

## 2022-06-03 ENCOUNTER — Telehealth: Payer: Self-pay

## 2022-06-03 LAB — CUP PACEART REMOTE DEVICE CHECK
Date Time Interrogation Session: 20230917231709
Implantable Pulse Generator Implant Date: 20191226

## 2022-06-03 NOTE — Telephone Encounter (Signed)
LINQ alert received.  Device has reached RRT 05/31/2022 Route to triage.  Attempted to contact patient to advise ILR @ RRT. No answer, LMTCB.   Marked "I" in [employer]. Discontinued from [place]. Canceled future remotes.

## 2022-06-12 DIAGNOSIS — R7989 Other specified abnormal findings of blood chemistry: Secondary | ICD-10-CM | POA: Diagnosis not present

## 2022-06-12 DIAGNOSIS — E785 Hyperlipidemia, unspecified: Secondary | ICD-10-CM | POA: Diagnosis not present

## 2022-06-12 DIAGNOSIS — E039 Hypothyroidism, unspecified: Secondary | ICD-10-CM | POA: Diagnosis not present

## 2022-06-12 DIAGNOSIS — E559 Vitamin D deficiency, unspecified: Secondary | ICD-10-CM | POA: Diagnosis not present

## 2022-06-12 DIAGNOSIS — R739 Hyperglycemia, unspecified: Secondary | ICD-10-CM | POA: Diagnosis not present

## 2022-06-12 DIAGNOSIS — F419 Anxiety disorder, unspecified: Secondary | ICD-10-CM | POA: Diagnosis not present

## 2022-06-19 DIAGNOSIS — F419 Anxiety disorder, unspecified: Secondary | ICD-10-CM | POA: Diagnosis not present

## 2022-06-19 DIAGNOSIS — E1169 Type 2 diabetes mellitus with other specified complication: Secondary | ICD-10-CM | POA: Diagnosis not present

## 2022-06-19 DIAGNOSIS — Z0000 Encounter for general adult medical examination without abnormal findings: Secondary | ICD-10-CM | POA: Diagnosis not present

## 2022-06-19 DIAGNOSIS — E039 Hypothyroidism, unspecified: Secondary | ICD-10-CM | POA: Diagnosis not present

## 2022-06-19 DIAGNOSIS — Z23 Encounter for immunization: Secondary | ICD-10-CM | POA: Diagnosis not present

## 2022-06-19 DIAGNOSIS — M26609 Unspecified temporomandibular joint disorder, unspecified side: Secondary | ICD-10-CM | POA: Diagnosis not present

## 2022-06-19 DIAGNOSIS — J302 Other seasonal allergic rhinitis: Secondary | ICD-10-CM | POA: Diagnosis not present

## 2022-06-19 DIAGNOSIS — Z8673 Personal history of transient ischemic attack (TIA), and cerebral infarction without residual deficits: Secondary | ICD-10-CM | POA: Diagnosis not present

## 2022-06-19 DIAGNOSIS — E559 Vitamin D deficiency, unspecified: Secondary | ICD-10-CM | POA: Diagnosis not present

## 2022-06-19 DIAGNOSIS — E785 Hyperlipidemia, unspecified: Secondary | ICD-10-CM | POA: Diagnosis not present

## 2022-07-03 DIAGNOSIS — H26493 Other secondary cataract, bilateral: Secondary | ICD-10-CM | POA: Diagnosis not present

## 2022-07-03 DIAGNOSIS — H524 Presbyopia: Secondary | ICD-10-CM | POA: Diagnosis not present

## 2022-07-03 DIAGNOSIS — H52203 Unspecified astigmatism, bilateral: Secondary | ICD-10-CM | POA: Diagnosis not present

## 2022-07-03 DIAGNOSIS — Z961 Presence of intraocular lens: Secondary | ICD-10-CM | POA: Diagnosis not present

## 2022-07-15 DIAGNOSIS — N94819 Vulvodynia, unspecified: Secondary | ICD-10-CM | POA: Diagnosis not present

## 2022-07-15 DIAGNOSIS — N952 Postmenopausal atrophic vaginitis: Secondary | ICD-10-CM | POA: Diagnosis not present

## 2022-08-26 DIAGNOSIS — E1169 Type 2 diabetes mellitus with other specified complication: Secondary | ICD-10-CM | POA: Diagnosis not present

## 2022-08-26 DIAGNOSIS — R82998 Other abnormal findings in urine: Secondary | ICD-10-CM | POA: Diagnosis not present

## 2022-09-07 DIAGNOSIS — R42 Dizziness and giddiness: Secondary | ICD-10-CM | POA: Diagnosis not present

## 2022-09-07 DIAGNOSIS — R051 Acute cough: Secondary | ICD-10-CM | POA: Diagnosis not present

## 2022-11-01 DIAGNOSIS — Z1231 Encounter for screening mammogram for malignant neoplasm of breast: Secondary | ICD-10-CM | POA: Diagnosis not present

## 2023-01-31 DIAGNOSIS — Z8673 Personal history of transient ischemic attack (TIA), and cerebral infarction without residual deficits: Secondary | ICD-10-CM | POA: Diagnosis not present

## 2023-01-31 DIAGNOSIS — M26609 Unspecified temporomandibular joint disorder, unspecified side: Secondary | ICD-10-CM | POA: Diagnosis not present

## 2023-01-31 DIAGNOSIS — M549 Dorsalgia, unspecified: Secondary | ICD-10-CM | POA: Diagnosis not present

## 2023-01-31 DIAGNOSIS — F419 Anxiety disorder, unspecified: Secondary | ICD-10-CM | POA: Diagnosis not present

## 2023-01-31 DIAGNOSIS — J302 Other seasonal allergic rhinitis: Secondary | ICD-10-CM | POA: Diagnosis not present

## 2023-01-31 DIAGNOSIS — L219 Seborrheic dermatitis, unspecified: Secondary | ICD-10-CM | POA: Diagnosis not present

## 2023-01-31 DIAGNOSIS — E1169 Type 2 diabetes mellitus with other specified complication: Secondary | ICD-10-CM | POA: Diagnosis not present

## 2023-06-18 DIAGNOSIS — E785 Hyperlipidemia, unspecified: Secondary | ICD-10-CM | POA: Diagnosis not present

## 2023-06-18 DIAGNOSIS — E559 Vitamin D deficiency, unspecified: Secondary | ICD-10-CM | POA: Diagnosis not present

## 2023-06-18 DIAGNOSIS — E099 Drug or chemical induced diabetes mellitus without complications: Secondary | ICD-10-CM | POA: Diagnosis not present

## 2023-06-18 DIAGNOSIS — Z1389 Encounter for screening for other disorder: Secondary | ICD-10-CM | POA: Diagnosis not present

## 2023-06-25 DIAGNOSIS — Z8673 Personal history of transient ischemic attack (TIA), and cerebral infarction without residual deficits: Secondary | ICD-10-CM | POA: Diagnosis not present

## 2023-06-25 DIAGNOSIS — Z1339 Encounter for screening examination for other mental health and behavioral disorders: Secondary | ICD-10-CM | POA: Diagnosis not present

## 2023-06-25 DIAGNOSIS — L219 Seborrheic dermatitis, unspecified: Secondary | ICD-10-CM | POA: Diagnosis not present

## 2023-06-25 DIAGNOSIS — E039 Hypothyroidism, unspecified: Secondary | ICD-10-CM | POA: Diagnosis not present

## 2023-06-25 DIAGNOSIS — E785 Hyperlipidemia, unspecified: Secondary | ICD-10-CM | POA: Diagnosis not present

## 2023-06-25 DIAGNOSIS — M26609 Unspecified temporomandibular joint disorder, unspecified side: Secondary | ICD-10-CM | POA: Diagnosis not present

## 2023-06-25 DIAGNOSIS — Z23 Encounter for immunization: Secondary | ICD-10-CM | POA: Diagnosis not present

## 2023-06-25 DIAGNOSIS — Z0000 Encounter for general adult medical examination without abnormal findings: Secondary | ICD-10-CM | POA: Diagnosis not present

## 2023-06-25 DIAGNOSIS — F419 Anxiety disorder, unspecified: Secondary | ICD-10-CM | POA: Diagnosis not present

## 2023-06-25 DIAGNOSIS — E1169 Type 2 diabetes mellitus with other specified complication: Secondary | ICD-10-CM | POA: Diagnosis not present

## 2023-06-25 DIAGNOSIS — Z1331 Encounter for screening for depression: Secondary | ICD-10-CM | POA: Diagnosis not present

## 2023-07-10 DIAGNOSIS — Z961 Presence of intraocular lens: Secondary | ICD-10-CM | POA: Diagnosis not present

## 2023-07-10 DIAGNOSIS — H26493 Other secondary cataract, bilateral: Secondary | ICD-10-CM | POA: Diagnosis not present

## 2023-07-10 DIAGNOSIS — H52203 Unspecified astigmatism, bilateral: Secondary | ICD-10-CM | POA: Diagnosis not present

## 2023-07-21 DIAGNOSIS — N94819 Vulvodynia, unspecified: Secondary | ICD-10-CM | POA: Diagnosis not present

## 2023-07-21 DIAGNOSIS — N952 Postmenopausal atrophic vaginitis: Secondary | ICD-10-CM | POA: Diagnosis not present

## 2023-10-28 DIAGNOSIS — L089 Local infection of the skin and subcutaneous tissue, unspecified: Secondary | ICD-10-CM | POA: Diagnosis not present

## 2023-10-28 DIAGNOSIS — L989 Disorder of the skin and subcutaneous tissue, unspecified: Secondary | ICD-10-CM | POA: Diagnosis not present

## 2023-11-14 DIAGNOSIS — Z1231 Encounter for screening mammogram for malignant neoplasm of breast: Secondary | ICD-10-CM | POA: Diagnosis not present

## 2023-12-29 DIAGNOSIS — L304 Erythema intertrigo: Secondary | ICD-10-CM | POA: Diagnosis not present

## 2023-12-29 DIAGNOSIS — N905 Atrophy of vulva: Secondary | ICD-10-CM | POA: Diagnosis not present

## 2023-12-30 DIAGNOSIS — J069 Acute upper respiratory infection, unspecified: Secondary | ICD-10-CM | POA: Diagnosis not present

## 2024-02-04 DIAGNOSIS — E785 Hyperlipidemia, unspecified: Secondary | ICD-10-CM | POA: Diagnosis not present

## 2024-02-04 DIAGNOSIS — E1169 Type 2 diabetes mellitus with other specified complication: Secondary | ICD-10-CM | POA: Diagnosis not present

## 2024-02-04 DIAGNOSIS — F419 Anxiety disorder, unspecified: Secondary | ICD-10-CM | POA: Diagnosis not present

## 2024-02-04 DIAGNOSIS — J302 Other seasonal allergic rhinitis: Secondary | ICD-10-CM | POA: Diagnosis not present

## 2024-02-04 DIAGNOSIS — L219 Seborrheic dermatitis, unspecified: Secondary | ICD-10-CM | POA: Diagnosis not present

## 2024-02-04 DIAGNOSIS — E039 Hypothyroidism, unspecified: Secondary | ICD-10-CM | POA: Diagnosis not present

## 2024-02-04 DIAGNOSIS — Z8673 Personal history of transient ischemic attack (TIA), and cerebral infarction without residual deficits: Secondary | ICD-10-CM | POA: Diagnosis not present

## 2024-02-04 DIAGNOSIS — E559 Vitamin D deficiency, unspecified: Secondary | ICD-10-CM | POA: Diagnosis not present

## 2024-02-04 DIAGNOSIS — M26609 Unspecified temporomandibular joint disorder, unspecified side: Secondary | ICD-10-CM | POA: Diagnosis not present

## 2024-03-17 DIAGNOSIS — N958 Other specified menopausal and perimenopausal disorders: Secondary | ICD-10-CM | POA: Diagnosis not present

## 2024-03-17 DIAGNOSIS — L304 Erythema intertrigo: Secondary | ICD-10-CM | POA: Diagnosis not present

## 2024-03-17 DIAGNOSIS — N952 Postmenopausal atrophic vaginitis: Secondary | ICD-10-CM | POA: Diagnosis not present

## 2024-05-09 DIAGNOSIS — M542 Cervicalgia: Secondary | ICD-10-CM | POA: Diagnosis not present

## 2024-06-03 DIAGNOSIS — M542 Cervicalgia: Secondary | ICD-10-CM | POA: Diagnosis not present

## 2024-06-25 DIAGNOSIS — E039 Hypothyroidism, unspecified: Secondary | ICD-10-CM | POA: Diagnosis not present

## 2024-06-25 DIAGNOSIS — E1169 Type 2 diabetes mellitus with other specified complication: Secondary | ICD-10-CM | POA: Diagnosis not present

## 2024-06-25 DIAGNOSIS — E559 Vitamin D deficiency, unspecified: Secondary | ICD-10-CM | POA: Diagnosis not present

## 2024-06-25 DIAGNOSIS — E7849 Other hyperlipidemia: Secondary | ICD-10-CM | POA: Diagnosis not present

## 2024-06-25 DIAGNOSIS — E785 Hyperlipidemia, unspecified: Secondary | ICD-10-CM | POA: Diagnosis not present

## 2024-07-02 DIAGNOSIS — J302 Other seasonal allergic rhinitis: Secondary | ICD-10-CM | POA: Diagnosis not present

## 2024-07-02 DIAGNOSIS — R82998 Other abnormal findings in urine: Secondary | ICD-10-CM | POA: Diagnosis not present

## 2024-07-02 DIAGNOSIS — E559 Vitamin D deficiency, unspecified: Secondary | ICD-10-CM | POA: Diagnosis not present

## 2024-07-02 DIAGNOSIS — Z1331 Encounter for screening for depression: Secondary | ICD-10-CM | POA: Diagnosis not present

## 2024-07-02 DIAGNOSIS — M549 Dorsalgia, unspecified: Secondary | ICD-10-CM | POA: Diagnosis not present

## 2024-07-02 DIAGNOSIS — Z0000 Encounter for general adult medical examination without abnormal findings: Secondary | ICD-10-CM | POA: Diagnosis not present

## 2024-07-02 DIAGNOSIS — Z23 Encounter for immunization: Secondary | ICD-10-CM | POA: Diagnosis not present

## 2024-07-02 DIAGNOSIS — E1169 Type 2 diabetes mellitus with other specified complication: Secondary | ICD-10-CM | POA: Diagnosis not present

## 2024-07-02 DIAGNOSIS — M26609 Unspecified temporomandibular joint disorder, unspecified side: Secondary | ICD-10-CM | POA: Diagnosis not present

## 2024-07-02 DIAGNOSIS — F419 Anxiety disorder, unspecified: Secondary | ICD-10-CM | POA: Diagnosis not present

## 2024-07-02 DIAGNOSIS — Z1339 Encounter for screening examination for other mental health and behavioral disorders: Secondary | ICD-10-CM | POA: Diagnosis not present

## 2024-07-02 DIAGNOSIS — E039 Hypothyroidism, unspecified: Secondary | ICD-10-CM | POA: Diagnosis not present

## 2024-07-02 DIAGNOSIS — E785 Hyperlipidemia, unspecified: Secondary | ICD-10-CM | POA: Diagnosis not present

## 2024-07-13 ENCOUNTER — Ambulatory Visit: Admitting: Dermatology

## 2024-07-15 DIAGNOSIS — H52203 Unspecified astigmatism, bilateral: Secondary | ICD-10-CM | POA: Diagnosis not present

## 2024-07-15 DIAGNOSIS — H26493 Other secondary cataract, bilateral: Secondary | ICD-10-CM | POA: Diagnosis not present

## 2024-07-15 DIAGNOSIS — Z961 Presence of intraocular lens: Secondary | ICD-10-CM | POA: Diagnosis not present

## 2024-08-02 DIAGNOSIS — T1501XA Foreign body in cornea, right eye, initial encounter: Secondary | ICD-10-CM | POA: Diagnosis not present

## 2024-08-05 DIAGNOSIS — H26493 Other secondary cataract, bilateral: Secondary | ICD-10-CM | POA: Diagnosis not present
